# Patient Record
Sex: Male | Born: 1985 | Race: Black or African American | Hispanic: No | Smoking: Never smoker
Health system: Southern US, Community
[De-identification: ages and names within clinical notes are randomized; demographics above are authoritative.]

## PROBLEM LIST (undated history)

## (undated) ENCOUNTER — Emergency Department (HOSPITAL_BASED_OUTPATIENT_CLINIC_OR_DEPARTMENT_OTHER): Admission: EM | Payer: Self-pay | Source: Home / Self Care

## (undated) DIAGNOSIS — J45909 Unspecified asthma, uncomplicated: Secondary | ICD-10-CM

## (undated) HISTORY — PX: TARSAL TUNNEL RELEASE: SUR1099

## (undated) HISTORY — PX: APPENDECTOMY: SHX54

---

## 2020-05-19 ENCOUNTER — Other Ambulatory Visit: Payer: Self-pay

## 2020-05-19 ENCOUNTER — Emergency Department (HOSPITAL_COMMUNITY)
Admission: EM | Admit: 2020-05-19 | Discharge: 2020-05-20 | Disposition: A | Discharge status: Home / Self Care | Payer: Self-pay | Attending: Emergency Medicine | Admitting: Emergency Medicine

## 2020-05-19 ENCOUNTER — Encounter (HOSPITAL_COMMUNITY): Payer: Self-pay

## 2020-05-19 ENCOUNTER — Emergency Department (HOSPITAL_COMMUNITY): Payer: Self-pay

## 2020-05-19 DIAGNOSIS — R0602 Shortness of breath: Secondary | ICD-10-CM | POA: Insufficient documentation

## 2020-05-19 DIAGNOSIS — Z5321 Procedure and treatment not carried out due to patient leaving prior to being seen by health care provider: Secondary | ICD-10-CM | POA: Insufficient documentation

## 2020-05-19 DIAGNOSIS — R42 Dizziness and giddiness: Secondary | ICD-10-CM | POA: Insufficient documentation

## 2020-05-19 DIAGNOSIS — R002 Palpitations: Secondary | ICD-10-CM | POA: Insufficient documentation

## 2020-05-19 DIAGNOSIS — R0981 Nasal congestion: Secondary | ICD-10-CM | POA: Insufficient documentation

## 2020-05-19 DIAGNOSIS — R0789 Other chest pain: Secondary | ICD-10-CM | POA: Insufficient documentation

## 2020-05-19 LAB — TROPONIN I (HIGH SENSITIVITY): Troponin I (High Sensitivity): 4 ng/L (ref <18)

## 2020-05-19 LAB — BASIC METABOLIC PANEL
Anion gap: 9 (ref 5–15)
BUN: 14 mg/dL (ref 6–20)
CO2: 22 mmol/L (ref 22–32)
Calcium: 9 mg/dL (ref 8.9–10.3)
Chloride: 106 mmol/L (ref 98–111)
Creatinine, Ser: 0.99 mg/dL (ref 0.61–1.24)
GFR, Estimated: >60 mL/min (ref >60)
Glucose, Bld: 92 mg/dL (ref 70–99)
Potassium: 4 mmol/L (ref 3.5–5.1)
Sodium: 137 mmol/L (ref 135–145)

## 2020-05-19 LAB — CBC
HCT: 43.5 % (ref 39.0–52.0)
Hemoglobin: 14.2 g/dL (ref 13.0–17.0)
MCH: 26.8 pg (ref 26.0–34.0)
MCHC: 32.6 g/dL (ref 30.0–36.0)
MCV: 82.2 fL (ref 80.0–100.0)
Platelets: 274 10*3/uL (ref 150–400)
RBC: 5.29 MIL/uL (ref 4.22–5.81)
RDW: 13.9 % (ref 11.5–15.5)
WBC: 8.5 10*3/uL (ref 4.0–10.5)
nRBC: 0 % (ref 0.0–0.2)

## 2020-05-19 NOTE — ED Triage Notes (Signed)
Pt states he started having chest pains a few days ago and it worsened today. Pt has left chest pain, dizziness, shortness of breath, feeling like his heart is racing and nausea.

## 2020-05-19 NOTE — ED Provider Notes (Signed)
MSE was initiated and I personally evaluated the patient and placed orders (if any) at  10:56 PM on May 19, 2020.  Patient here with chest pain times the past few days.  States that symptoms worsened today.  Reports left-sided chest pain, dizziness, shortness of breath.  He reports associated palpitations.  Denies any heart or lung problems.  States that he has had some runny nose.  ROS: As listed above  PE: Alert and oriented Answers questions appropriately No respiratory distress Lungs are clear  Discussed with patient that their care has been initiated.   They are counseled that they will need to remain in the ED until the completion of their workup, including full H&P and results of any tests.  Risks of leaving the emergency department prior to completion of treatment were discussed. Patient was advised to inform ED staff if they are leaving before their treatment is complete. The patient acknowledged these risks and time was allowed for questions.    The patient appears stable so that the remainder of the MSE may be completed by another provider.    Roxy Horseman, PA-C 05/19/20 2256    Jacalyn Lefevre, MD 05/19/20 2258

## 2020-05-20 NOTE — ED Notes (Signed)
Called pt multiple times for room but no answer inside or out

## 2020-05-22 ENCOUNTER — Encounter (HOSPITAL_COMMUNITY): Payer: Self-pay | Admitting: Emergency Medicine

## 2020-05-22 ENCOUNTER — Emergency Department (HOSPITAL_COMMUNITY)
Admission: EM | Admit: 2020-05-22 | Discharge: 2020-05-23 | Discharge status: Against Medical Advice | Payer: Self-pay | Attending: Emergency Medicine | Admitting: Emergency Medicine

## 2020-05-22 ENCOUNTER — Emergency Department (HOSPITAL_COMMUNITY): Payer: Self-pay

## 2020-05-22 ENCOUNTER — Other Ambulatory Visit: Payer: Self-pay

## 2020-05-22 DIAGNOSIS — R0981 Nasal congestion: Secondary | ICD-10-CM | POA: Insufficient documentation

## 2020-05-22 DIAGNOSIS — J45909 Unspecified asthma, uncomplicated: Secondary | ICD-10-CM | POA: Insufficient documentation

## 2020-05-22 DIAGNOSIS — R519 Headache, unspecified: Secondary | ICD-10-CM | POA: Insufficient documentation

## 2020-05-22 DIAGNOSIS — R079 Chest pain, unspecified: Secondary | ICD-10-CM | POA: Insufficient documentation

## 2020-05-22 DIAGNOSIS — Z5321 Procedure and treatment not carried out due to patient leaving prior to being seen by health care provider: Secondary | ICD-10-CM | POA: Insufficient documentation

## 2020-05-22 HISTORY — DX: Unspecified asthma, uncomplicated: J45.909

## 2020-05-22 LAB — CBC
HCT: 43.7 % (ref 39.0–52.0)
Hemoglobin: 14.6 g/dL (ref 13.0–17.0)
MCH: 27.5 pg (ref 26.0–34.0)
MCHC: 33.4 g/dL (ref 30.0–36.0)
MCV: 82.5 fL (ref 80.0–100.0)
Platelets: 284 10*3/uL (ref 150–400)
RBC: 5.3 MIL/uL (ref 4.22–5.81)
RDW: 13.9 % (ref 11.5–15.5)
WBC: 9.2 10*3/uL (ref 4.0–10.5)
nRBC: 0 % (ref 0.0–0.2)

## 2020-05-22 LAB — BASIC METABOLIC PANEL
Anion gap: 7 (ref 5–15)
BUN: 14 mg/dL (ref 6–20)
CO2: 25 mmol/L (ref 22–32)
Calcium: 9.2 mg/dL (ref 8.9–10.3)
Chloride: 103 mmol/L (ref 98–111)
Creatinine, Ser: 1.04 mg/dL (ref 0.61–1.24)
GFR, Estimated: >60 mL/min (ref >60)
Glucose, Bld: 100 mg/dL — ABNORMAL HIGH (ref 70–99)
Potassium: 4 mmol/L (ref 3.5–5.1)
Sodium: 135 mmol/L (ref 135–145)

## 2020-05-22 LAB — TROPONIN I (HIGH SENSITIVITY)
Troponin I (High Sensitivity): 4 ng/L (ref <18)
Troponin I (High Sensitivity): 4 ng/L (ref <18)

## 2020-05-22 NOTE — ED Provider Notes (Addendum)
Emergency Medicine Provider Triage Evaluation Note  Christopher Long , a 35 y.o. male  was evaluated in triage.  Pt complains of chest pain, shortness of breath, nausea, nasal congestion, headache.  Symptoms began 2 days ago.  He was seen 2 days ago, but left before completing his work-up.  He reports symptoms have persisted, but not worsened.  No history of similar.  He does have history of asthma, reports symptoms not improved with nebulizer.  He denies sick contacts.  He is vaccinated for COVID, flu.  Recently moved to the area approximately 5 months ago.  He reports a fever this morning, none currently.  He denies cough, abdominal pain, urinary symptoms, abnormal bowel movements.  Review of Systems  Positive: Fever, congestion, shortness of breath, nausea Negative: Cough  Physical Exam  BP (!) 128/91 (BP Location: Left Arm)   Pulse 90   Temp 99.2 F (37.3 C)   Resp 17   Ht 5\' 11"  (1.803 m)   Wt 117.9 kg   SpO2 95%   BMI 36.26 kg/m  Gen:   Awake, no distress   Resp:  Normal effort, clear lung sounds. No ttp of chest wall MSK:   Moves extremities without difficulty  Other:  No ttp of abd.  Medical Decision Making  Medically screening exam initiated at 4:50 PM.  Appropriate orders placed.  Markice Torbert was informed that the remainder of the evaluation will be completed by another provider, this initial triage assessment does not replace that evaluation, and the importance of remaining in the ED until their evaluation is complete.  Labs and COVID testing ordered.  Chest x-ray from 2 days ago was negative, will not repeat   Ritta Slot, PA-C 05/22/20 1652  Radiology took the patient for x-ray before recognizing x-ray orders were discontinued.  Patient is adamant that he get x-rays at this time, in the setting of continued symptoms and pt's insistence, will place orders    05/24/20, PA-C 05/22/20 1715    05/24/20, MD 05/23/20 [phone]

## 2020-05-22 NOTE — ED Triage Notes (Signed)
Pt reports intermittent mid chest pain that radiates to left back with sob that started two days ago with nausea. Pt reports he feels the pain more with movement. Pt also reports sinus pressure and headache. Hx of asthma, attempted nebulizer at home with no relief.

## 2021-01-25 ENCOUNTER — Other Ambulatory Visit: Payer: Self-pay

## 2021-01-25 ENCOUNTER — Encounter (HOSPITAL_COMMUNITY): Payer: Self-pay | Admitting: Emergency Medicine

## 2021-01-25 ENCOUNTER — Emergency Department (HOSPITAL_COMMUNITY)
Admission: EM | Admit: 2021-01-25 | Discharge: 2021-01-25 | Disposition: A | Discharge status: Home / Self Care | Payer: Self-pay | Attending: Emergency Medicine | Admitting: Emergency Medicine

## 2021-01-25 DIAGNOSIS — Z20822 Contact with and (suspected) exposure to covid-19: Secondary | ICD-10-CM | POA: Insufficient documentation

## 2021-01-25 DIAGNOSIS — J020 Streptococcal pharyngitis: Secondary | ICD-10-CM | POA: Insufficient documentation

## 2021-01-25 LAB — RESP PANEL BY RT-PCR (FLU A&B, COVID) ARPGX2
Influenza A by PCR: NEGATIVE
Influenza B by PCR: NEGATIVE
SARS Coronavirus 2 by RT PCR: NEGATIVE

## 2021-01-25 LAB — GROUP A STREP BY PCR: Group A Strep by PCR: DETECTED — AB

## 2021-01-25 MED ORDER — PENICILLIN G BENZATHINE 1200000 UNIT/2ML IM SUSY
1.2000 10*6.[IU] | PREFILLED_SYRINGE | Freq: Once | INTRAMUSCULAR | Status: AC
  Administered 2021-01-25: 1.2 10*6.[IU] via INTRAMUSCULAR
  Filled 2021-01-25: qty 2

## 2021-01-25 MED ORDER — AMOXICILLIN 500 MG PO CAPS: 500.0000 mg | ORAL_CAPSULE | Freq: Once | ORAL | Status: DC

## 2021-01-25 MED ORDER — IBUPROFEN 800 MG PO TABS
800.0000 mg | ORAL_TABLET | Freq: Once | ORAL | Status: AC
Start: 2021-01-25 — End: 2021-01-25
  Administered 2021-01-25: 800 mg via ORAL
  Filled 2021-01-25: qty 1

## 2021-01-25 MED ORDER — PENICILLIN G BENZATHINE 1200000 UNIT/2ML IM SUSY: 1.2000 10*6.[IU] | PREFILLED_SYRINGE | Freq: Once | INTRAMUSCULAR | Status: DC

## 2021-01-25 MED ORDER — CETIRIZINE HCL 10 MG PO TABS: 10.0000 mg | ORAL_TABLET | Freq: Every day | ORAL | 0 refills | Status: DC

## 2021-01-25 MED ORDER — IBUPROFEN 600 MG PO TABS: 600.0000 mg | ORAL_TABLET | Freq: Four times a day (QID) | ORAL | 0 refills | Status: DC | PRN

## 2021-01-25 MED ORDER — LIDOCAINE VISCOUS HCL 2 % MT SOLN
15.0000 mL | Freq: Once | OROMUCOSAL | Status: AC
Start: 2021-01-25 — End: 2021-01-25
  Administered 2021-01-25: 15 mL via OROMUCOSAL
  Filled 2021-01-25: qty 15

## 2021-01-25 MED ORDER — DEXAMETHASONE SODIUM PHOSPHATE 10 MG/ML IJ SOLN
10.0000 mg | Freq: Once | INTRAMUSCULAR | Status: AC
  Administered 2021-01-25: 10 mg via INTRAMUSCULAR
  Filled 2021-01-25: qty 1

## 2021-01-25 NOTE — ED Notes (Signed)
Patient not visualized in room to provide with discharge papers. Told staff he was leaving prior to them being printed.

## 2021-01-25 NOTE — ED Provider Notes (Signed)
[facility]-EMERGENCY DEPT Provider Note   CSN: [id number] Arrival date & time: 01/25/21  1523     History  No chief complaint on file.   Christopher Long is a 36 y.o. male.  Christopher Long is a 36 y.o. male who presents to the emergency department for evaluation of 2 days of fever, sore throat, headache and neck stiffness.  Reports he has had nasal congestion going on for about a week and sinus pressure.  Reports he has severe sore throat, has not had much to eat or drink due to pain with swallowing but is able to tolerate secretions.  Last took Tylenol at 1230 for fever and headache.  He reports an occasional cough with mucus, denies chest pain or shortness of breath.  No abdominal pain, nausea or vomiting.  Unsure of sick contacts.  Recently moved to the area.  The history is provided by the patient.      Home Medications Prior to Admission medications   Medication Sig Start Date End Date Taking? Authorizing Provider  cetirizine (ZYRTEC ALLERGY) 10 MG tablet Take 1 tablet (10 mg total) by mouth daily. 01/25/21  Yes Dartha Lodge, PA-C  ibuprofen (ADVIL) 600 MG tablet Take 1 tablet (600 mg total) by mouth every 6 (six) hours as needed. 01/25/21  Yes Dartha Lodge, PA-C      Allergies    Patient has no known allergies.    Review of Systems   Review of Systems  Constitutional:  Positive for chills and fever.  HENT:  Positive for congestion, rhinorrhea, sore throat and trouble swallowing.   Respiratory:  Positive for cough. Negative for shortness of breath.   Cardiovascular:  Negative for chest pain.  Gastrointestinal:  Negative for abdominal pain, nausea and vomiting.  Musculoskeletal:  Negative for neck pain and neck stiffness.  Skin:  Negative for rash.  Neurological:  Positive for headaches.   Physical Exam Updated Vital Signs BP 133/82 (BP Location: Right Arm)    Pulse (!) 108    Temp (!) 100.6 F (38.1 C) (Oral)    Resp 18    Ht 5\' 11"  (1.803 m)     Wt 118 kg    SpO2 98%    BMI 36.28 kg/m  Physical Exam Vitals and nursing note reviewed.  Constitutional:      General: He is not in acute distress.    Appearance: Normal appearance. He is well-developed. He is not ill-appearing or diaphoretic.  HENT:     Head: Normocephalic and atraumatic.     Nose: Congestion and rhinorrhea present.     Mouth/Throat:     Mouth: Mucous membranes are moist.     Pharynx: Oropharyngeal exudate and posterior oropharyngeal erythema present.     Comments: Posterior oropharynx is erythematous with tonsillar edema and exudate, uvula is midline, no trismus, normal phonation, tolerating secretions Eyes:     General:        Right eye: No discharge.        Left eye: No discharge.  Cardiovascular:     Rate and Rhythm: Normal rate and regular rhythm.     Heart sounds: Normal heart sounds.  Pulmonary:     Effort: Pulmonary effort is normal. No respiratory distress.     Breath sounds: Normal breath sounds.  Abdominal:     General: Bowel sounds are normal. There is no distension.     Palpations: Abdomen is soft. There is no mass.     Tenderness: There is  no abdominal tenderness.  Musculoskeletal:        General: No deformity.     Cervical back: Neck supple. No rigidity.  Lymphadenopathy:     Cervical: Cervical adenopathy present.  Skin:    General: Skin is warm and dry.  Neurological:     Mental Status: He is alert and oriented to person, place, and time.     Coordination: Coordination normal.     Comments: Speech is clear, able to follow commands Moves extremities without ataxia, coordination intact  Psychiatric:        Mood and Affect: Mood normal.        Behavior: Behavior normal.    ED Results / Procedures / Treatments   Labs (all labs ordered are listed, but only abnormal results are displayed) Labs Reviewed  GROUP A STREP BY PCR - Abnormal; Notable for the following components:      Result Value   Group A Strep by PCR DETECTED (*)    All  other components within normal limits  RESP PANEL BY RT-PCR (FLU A&B, COVID) ARPGX2    EKG None  Radiology No results found.  Procedures Procedures    Medications Ordered in ED Medications  ibuprofen (ADVIL) tablet 800 mg (800 mg Oral Given 01/25/21 1549)  dexamethasone (DECADRON) injection 10 mg (10 mg Intramuscular Given 01/25/21 1710)  lidocaine (XYLOCAINE) 2 % viscous mouth solution 15 mL (15 mLs Mouth/Throat Given 01/25/21 1713)  penicillin g benzathine (BICILLIN LA) 1200000 UNIT/2ML injection 1.2 Million Units (1.2 Million Units Intramuscular Given 01/25/21 1711)    ED Course/ Medical Decision Making/ A&P                            Pt febrile with tonsillar exudate, cervical lymphadenopathy, & dysphagia; diagnosis of bacterial pharyngitis. Treated in the ED with steroids, NSAIDs, viscous lidocaine and PCN IM.  Pt appears mildly dehydrated, discussed importance of water rehydration. Presentation non concerning for PTA or RPA, so do not feel labs or imaging is indicated at this time. No trismus or uvula deviation. Specific return precautions discussed. Pt able to drink water in ED without difficulty with intact air way. Recommended PCP follow up.        Final Clinical Impression(s) / ED Diagnoses Final diagnoses:  Strep throat    Rx / DC Orders ED Discharge Orders          Ordered    ibuprofen (ADVIL) 600 MG tablet  Every 6 hours PRN        01/25/21 1726    cetirizine (ZYRTEC ALLERGY) 10 MG tablet  Daily        01/25/21 1726              Legrand Rams 01/25/21 2334    Lorre Nick, MD 01/29/21 1719

## 2021-01-25 NOTE — ED Triage Notes (Addendum)
Patient reports feeling unwell, HA, sore throat and neck stiffness since yesterday. Submandibular lymph nodes swollen and tender to palpation. Throat w/ erythema, due to tongue unable to visualize tonsils. Took tylenol at 12:30.

## 2021-01-25 NOTE — Discharge Instructions (Signed)
You were treated today with antibiotics for strep throat, this should start to improve over the next 48 hours.  You may use ibuprofen and Tylenol every 6 hours for pain as well as Cepacol throat lozenges.  Make sure you are drinking plenty of fluids.  To help with nasal congestion take Zyrtec daily.  Suspect some of this is likely related to allergies since you are new to the area.  You can use the phone number on your paperwork today to establish primary care with a local PCP.  Return for worsening throat pain, fevers, difficulty swallowing or breathing or any other new or concerning symptoms.

## 2021-10-20 ENCOUNTER — Encounter (HOSPITAL_COMMUNITY): Payer: Self-pay

## 2021-10-20 ENCOUNTER — Emergency Department (HOSPITAL_COMMUNITY): Payer: Self-pay

## 2021-10-20 ENCOUNTER — Emergency Department (HOSPITAL_COMMUNITY)
Admission: EM | Admit: 2021-10-20 | Discharge: 2021-10-20 | Discharge status: Against Medical Advice | Payer: Self-pay | Attending: Emergency Medicine | Admitting: Emergency Medicine

## 2021-10-20 DIAGNOSIS — Z5321 Procedure and treatment not carried out due to patient leaving prior to being seen by health care provider: Secondary | ICD-10-CM | POA: Insufficient documentation

## 2021-10-20 DIAGNOSIS — R079 Chest pain, unspecified: Secondary | ICD-10-CM | POA: Insufficient documentation

## 2021-10-20 DIAGNOSIS — R110 Nausea: Secondary | ICD-10-CM | POA: Insufficient documentation

## 2021-10-20 LAB — CBC
HCT: 40.6 % (ref 39.0–52.0)
Hemoglobin: 13.3 g/dL (ref 13.0–17.0)
MCH: 27.3 pg (ref 26.0–34.0)
MCHC: 32.8 g/dL (ref 30.0–36.0)
MCV: 83.4 fL (ref 80.0–100.0)
Platelets: 257 10*3/uL (ref 150–400)
RBC: 4.87 MIL/uL (ref 4.22–5.81)
RDW: 14.2 % (ref 11.5–15.5)
WBC: 8.8 10*3/uL (ref 4.0–10.5)
nRBC: 0 % (ref 0.0–0.2)

## 2021-10-20 LAB — BASIC METABOLIC PANEL
Anion gap: 7 (ref 5–15)
BUN: 16 mg/dL (ref 6–20)
CO2: 24 mmol/L (ref 22–32)
Calcium: 8.5 mg/dL — ABNORMAL LOW (ref 8.9–10.3)
Chloride: 107 mmol/L (ref 98–111)
Creatinine, Ser: 1.02 mg/dL (ref 0.61–1.24)
GFR, Estimated: >60 mL/min (ref >60)
Glucose, Bld: 102 mg/dL — ABNORMAL HIGH (ref 70–99)
Potassium: 3.7 mmol/L (ref 3.5–5.1)
Sodium: 138 mmol/L (ref 135–145)

## 2021-10-20 LAB — TROPONIN I (HIGH SENSITIVITY): Troponin I (High Sensitivity): 4 ng/L (ref <18)

## 2021-10-20 NOTE — ED Notes (Signed)
Called 3 times 

## 2021-10-20 NOTE — ED Triage Notes (Signed)
Pt states that he has been having CP and nausea for the past 3 hours, denies SOB or dizziness

## 2022-04-23 IMAGING — CR DG CHEST 2V
2 series · 2 of 2 positions shown · non-contrast
Comparison: None.

CLINICAL DATA: Chest pain

EXAM:
CHEST - 2 VIEW

[chest pa]
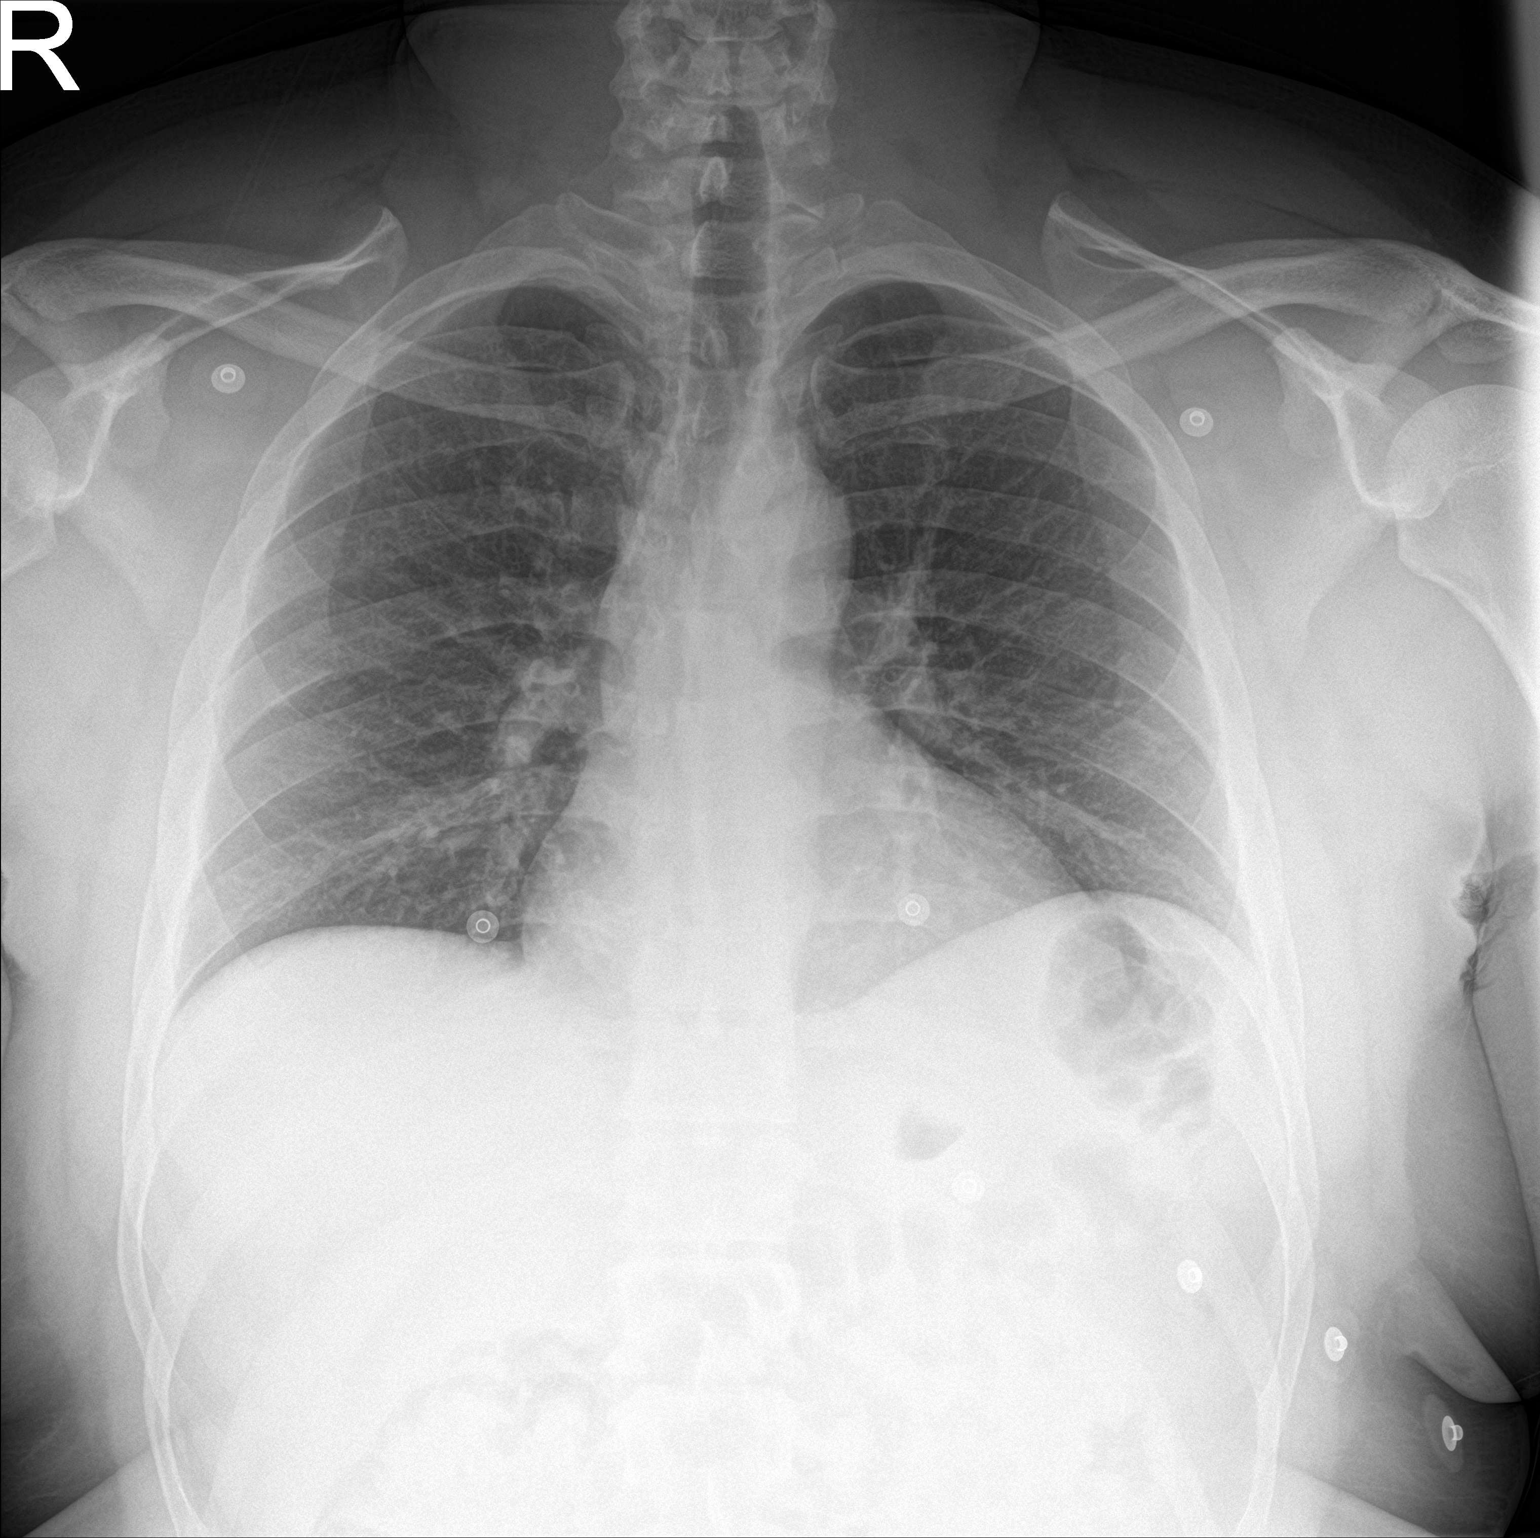

[chest lat]
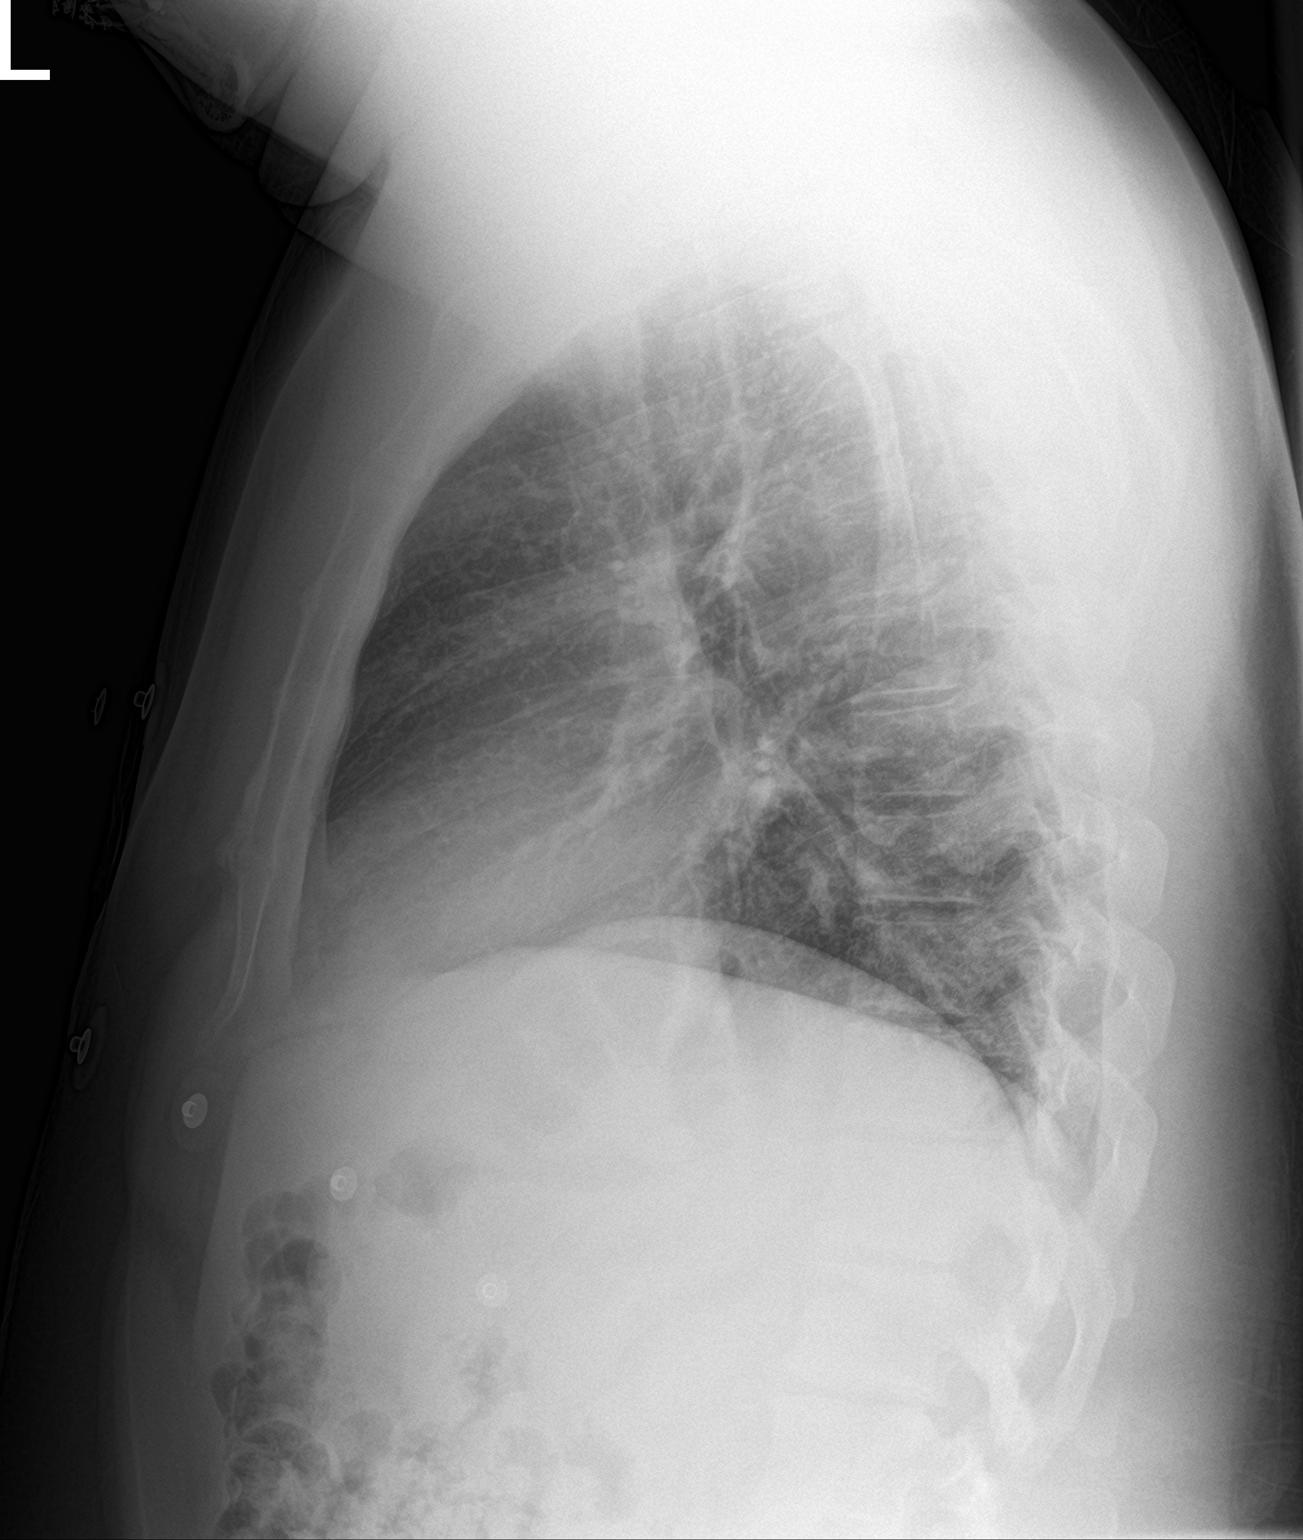

[2 of 2 positions shown; findings below may reference images not displayed]

FINDINGS: Lungs are clear. Heart size and pulmonary vascularity are normal. No
adenopathy. No pneumothorax. No bone lesions.
IMPRESSION: Lungs clear.  Cardiac silhouette normal.

## 2022-04-26 IMAGING — DX DG CHEST 2V
2 series · 2 of 2 positions shown · non-contrast
Comparison: May 19, 2020

CLINICAL DATA: Chest pain.

EXAM:
CHEST - 2 VIEW

[chest pa]
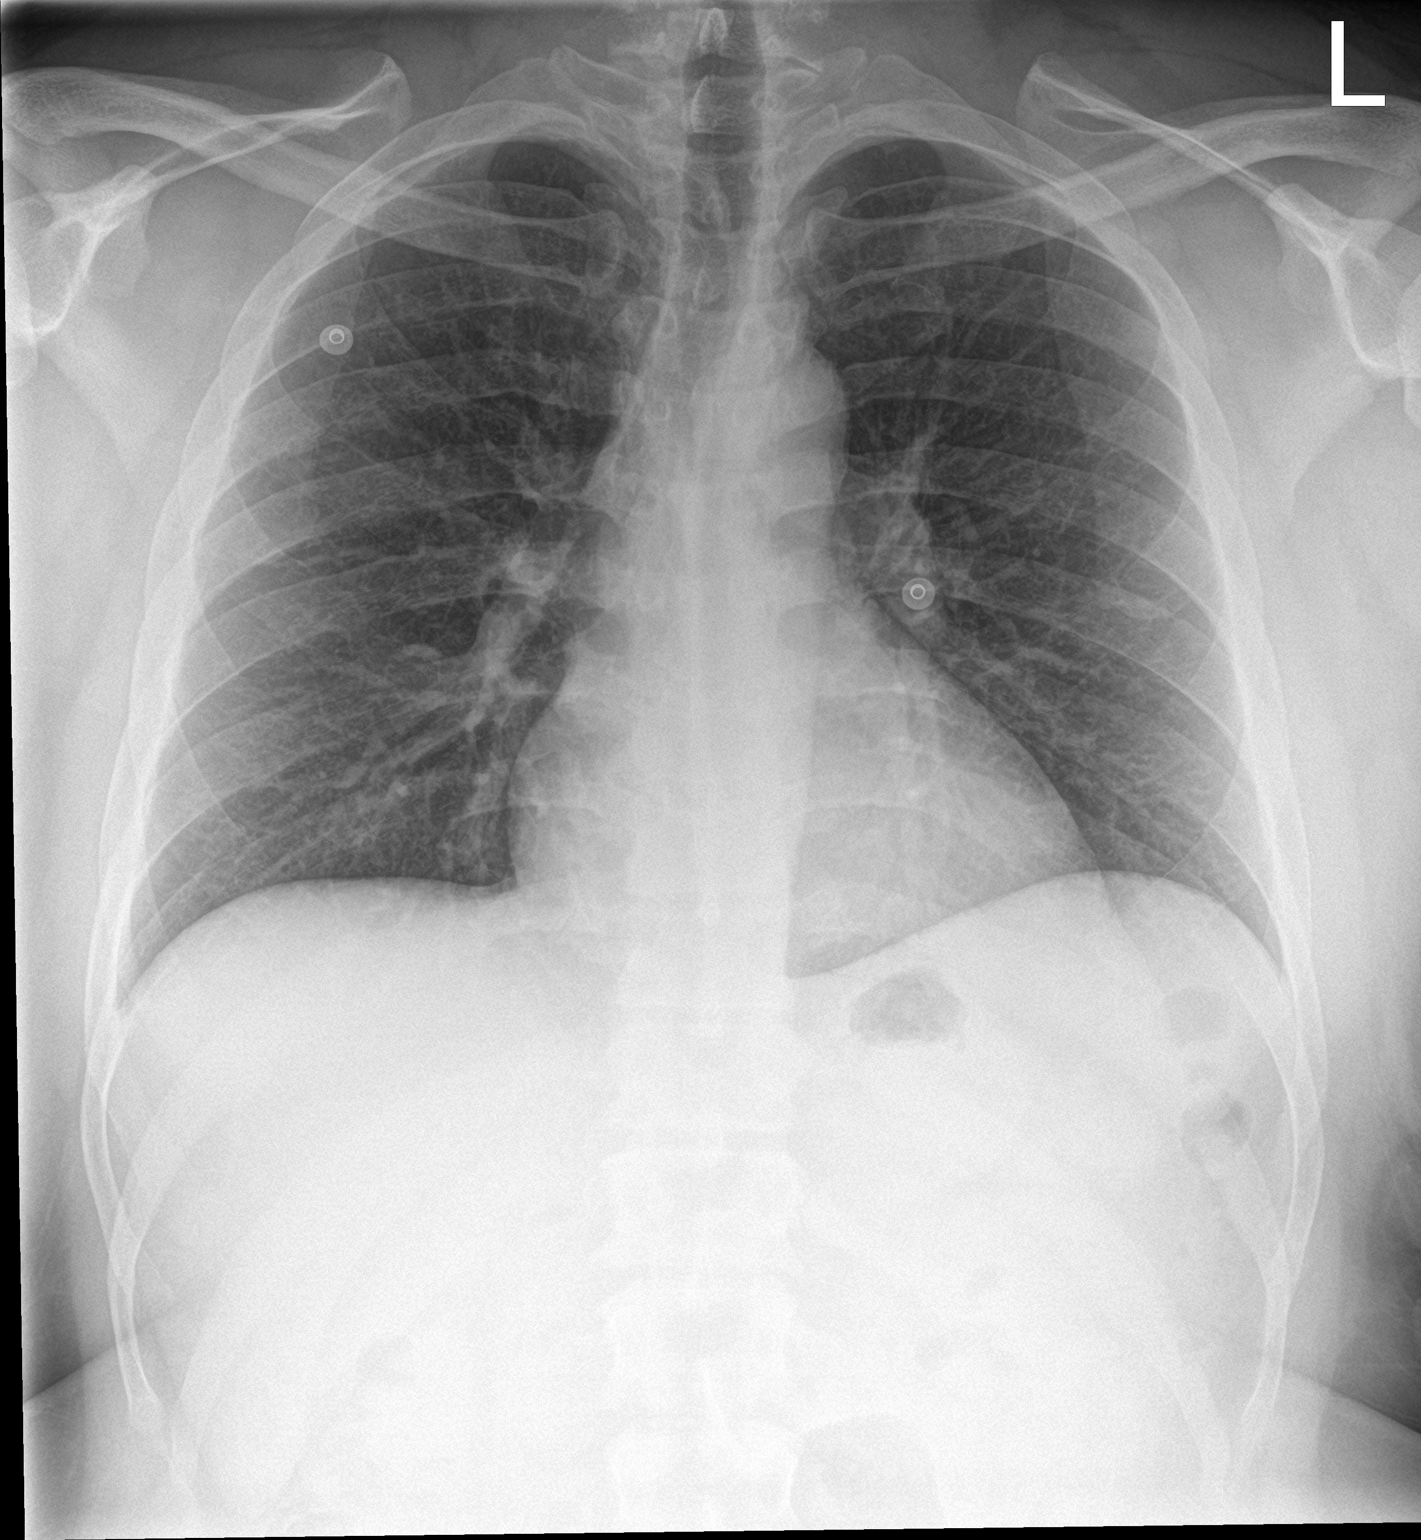

[chest lat]
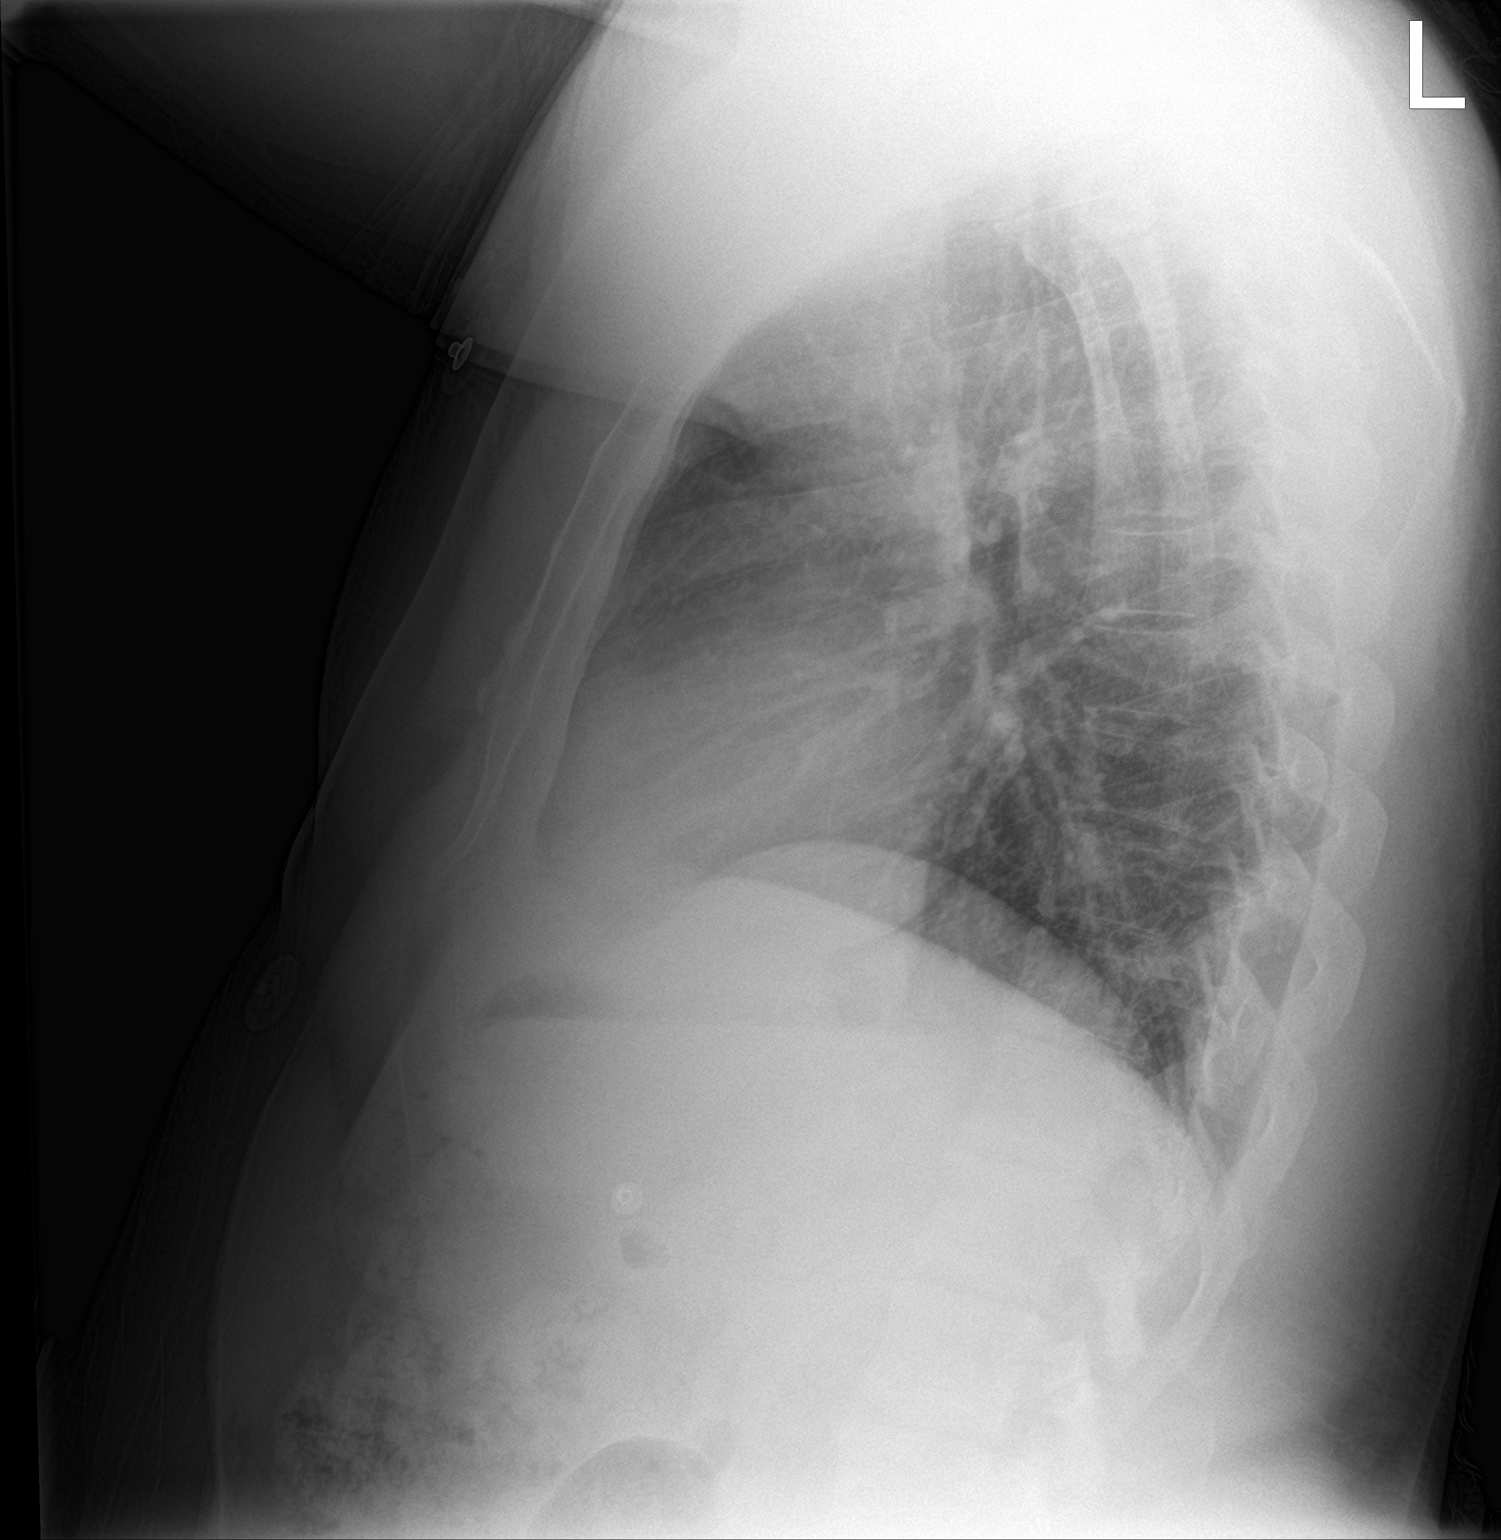

[2 of 2 positions shown; findings below may reference images not displayed]

FINDINGS: The heart size and mediastinal contours are within normal limits.
Both lungs are clear. The visualized skeletal structures are
unremarkable.
IMPRESSION: No active cardiopulmonary disease.

## 2023-03-26 ENCOUNTER — Emergency Department (HOSPITAL_COMMUNITY)
Admission: EM | Admit: 2023-03-26 | Discharge: 2023-03-27 | Discharge status: Against Medical Advice | Payer: Self-pay | Attending: Emergency Medicine | Admitting: Emergency Medicine

## 2023-03-26 ENCOUNTER — Encounter (HOSPITAL_COMMUNITY): Payer: Self-pay

## 2023-03-26 ENCOUNTER — Other Ambulatory Visit: Payer: Self-pay

## 2023-03-26 DIAGNOSIS — Z5321 Procedure and treatment not carried out due to patient leaving prior to being seen by health care provider: Secondary | ICD-10-CM | POA: Insufficient documentation

## 2023-03-26 DIAGNOSIS — L97929 Non-pressure chronic ulcer of unspecified part of left lower leg with unspecified severity: Secondary | ICD-10-CM | POA: Insufficient documentation

## 2023-03-26 DIAGNOSIS — J029 Acute pharyngitis, unspecified: Secondary | ICD-10-CM | POA: Insufficient documentation

## 2023-03-26 LAB — GROUP A STREP BY PCR: Group A Strep by PCR: NOT DETECTED

## 2023-03-26 NOTE — ED Notes (Signed)
 No response in lobby over 15+ mins

## 2023-03-26 NOTE — ED Provider Triage Note (Signed)
 Emergency Medicine Provider Triage Evaluation Note  Christopher Long , a 38 y.o. male  was evaluated in triage.  Pt complains of sore throat. Sore throat x 2 days.  Son recently had strep throat.  Also report ulcers to L thigh, that is painful.  Ongoing for several days.  No new sexual partner.  No hx of herpes or syphilis.    Review of Systems  Positive: As above Negative: As above  Physical Exam  BP (!) 148/105 (BP Location: Right Arm)   Pulse 93   Temp 99.1 F (37.3 C)   Resp (!) 24   Ht 5\' 10"  (1.778 m)   Wt 129.3 kg   SpO2 97%   BMI 40.89 kg/m  Gen:   Awake, no distress   Resp:  Normal effort  MSK:   Moves extremities without difficulty  Other:  Ulceration noted to L medial thigh  Medical Decision Making  Medically screening exam initiated at 5:18 PM.  Appropriate orders placed.  Christopher Long was informed that the remainder of the evaluation will be completed by another provider, this initial triage assessment does not replace that evaluation, and the importance of remaining in the ED until their evaluation is complete.     Christopher Helper, PA-C 03/26/23 [phone]

## 2023-03-26 NOTE — ED Notes (Signed)
No response for vitals  

## 2023-03-26 NOTE — ED Triage Notes (Signed)
 Pt reports sore throat  x 3 days and reports pain with swallowing. His son was diagnosed with strep throat 2 days ago.

## 2023-03-29 LAB — HSV CULTURE AND TYPING

## 2023-05-29 ENCOUNTER — Emergency Department (HOSPITAL_COMMUNITY): Payer: Self-pay

## 2023-05-29 ENCOUNTER — Other Ambulatory Visit: Payer: Self-pay

## 2023-05-29 ENCOUNTER — Encounter (HOSPITAL_COMMUNITY): Payer: Self-pay

## 2023-05-29 ENCOUNTER — Inpatient Hospital Stay (HOSPITAL_COMMUNITY)
Admission: EM | Admit: 2023-05-29 | Discharge: 2023-05-30 | DRG: 603 | Disposition: A | Discharge status: Home / Self Care | Payer: Self-pay | Attending: General Surgery | Admitting: General Surgery

## 2023-05-29 DIAGNOSIS — Z6836 Body mass index (BMI) 36.0-36.9, adult: Secondary | ICD-10-CM

## 2023-05-29 DIAGNOSIS — R188 Other ascites: Secondary | ICD-10-CM | POA: Diagnosis present

## 2023-05-29 DIAGNOSIS — Z9049 Acquired absence of other specified parts of digestive tract: Secondary | ICD-10-CM

## 2023-05-29 DIAGNOSIS — Z79899 Other long term (current) drug therapy: Secondary | ICD-10-CM

## 2023-05-29 DIAGNOSIS — L03311 Cellulitis of abdominal wall: Secondary | ICD-10-CM | POA: Diagnosis present

## 2023-05-29 DIAGNOSIS — Z881 Allergy status to other antibiotic agents status: Secondary | ICD-10-CM

## 2023-05-29 DIAGNOSIS — L02211 Cutaneous abscess of abdominal wall: Principal | ICD-10-CM | POA: Diagnosis present

## 2023-05-29 DIAGNOSIS — E669 Obesity, unspecified: Secondary | ICD-10-CM | POA: Diagnosis present

## 2023-05-29 LAB — URINALYSIS, ROUTINE W REFLEX MICROSCOPIC
Bilirubin Urine: NEGATIVE
Glucose, UA: 150 mg/dL — AB
Hgb urine dipstick: NEGATIVE
Ketones, ur: NEGATIVE mg/dL
Leukocytes,Ua: NEGATIVE
Nitrite: NEGATIVE
Protein, ur: NEGATIVE mg/dL
Specific Gravity, Urine: 1.02 (ref 1.005–1.030)
pH: 7 (ref 5.0–8.0)

## 2023-05-29 LAB — COMPREHENSIVE METABOLIC PANEL WITH GFR
ALT: 21 U/L (ref 0–44)
AST: 20 U/L (ref 15–41)
Albumin: 3.8 g/dL (ref 3.5–5.0)
Alkaline Phosphatase: 57 U/L (ref 38–126)
Anion gap: 9 (ref 5–15)
BUN: 8 mg/dL (ref 6–20)
CO2: 23 mmol/L (ref 22–32)
Calcium: 9.1 mg/dL (ref 8.9–10.3)
Chloride: 102 mmol/L (ref 98–111)
Creatinine, Ser: 1.05 mg/dL (ref 0.61–1.24)
GFR, Estimated: >60 mL/min (ref >60)
Glucose, Bld: 108 mg/dL — ABNORMAL HIGH (ref 70–99)
Potassium: 3.7 mmol/L (ref 3.5–5.1)
Sodium: 134 mmol/L — ABNORMAL LOW (ref 135–145)
Total Bilirubin: 1 mg/dL (ref 0.0–1.2)
Total Protein: 7.8 g/dL (ref 6.5–8.1)

## 2023-05-29 LAB — CBC
HCT: 43.1 % (ref 39.0–52.0)
Hemoglobin: 14.2 g/dL (ref 13.0–17.0)
MCH: 27.3 pg (ref 26.0–34.0)
MCHC: 32.9 g/dL (ref 30.0–36.0)
MCV: 82.7 fL (ref 80.0–100.0)
Platelets: 277 10*3/uL (ref 150–400)
RBC: 5.21 MIL/uL (ref 4.22–5.81)
RDW: 13.7 % (ref 11.5–15.5)
WBC: 14.3 10*3/uL — ABNORMAL HIGH (ref 4.0–10.5)
nRBC: 0 % (ref 0.0–0.2)

## 2023-05-29 LAB — LACTIC ACID, PLASMA: Lactic Acid, Venous: 1 mmol/L (ref 0.5–1.9)

## 2023-05-29 LAB — HIV ANTIBODY (ROUTINE TESTING W REFLEX): HIV Screen 4th Generation wRfx: NONREACTIVE

## 2023-05-29 LAB — LIPASE, BLOOD: Lipase: 25 U/L (ref 11–51)

## 2023-05-29 MED ORDER — IOHEXOL 350 MG/ML SOLN
75.0000 mL | Freq: Once | INTRAVENOUS | Status: AC | PRN
  Administered 2023-05-29: 75 mL via INTRAVENOUS

## 2023-05-29 MED ORDER — VANCOMYCIN HCL 1750 MG/350ML IV SOLN
1750.0000 mg | Freq: Once | INTRAVENOUS | Status: AC
  Administered 2023-05-29: 1750 mg via INTRAVENOUS
  Filled 2023-05-29: qty 350

## 2023-05-29 MED ORDER — VANCOMYCIN HCL 1.25 G IV SOLR
1250.0000 mg | Freq: Two times a day (BID) | INTRAVENOUS | Status: DC
  Administered 2023-05-29 – 2023-05-30 (×2): 1250 mg via INTRAVENOUS
  Filled 2023-05-29 (×2): qty 25

## 2023-05-29 MED ORDER — METHOCARBAMOL 500 MG PO TABS
500.0000 mg | ORAL_TABLET | Freq: Three times a day (TID) | ORAL | Status: DC | PRN
  Administered 2023-05-29 – 2023-05-30 (×2): 500 mg via ORAL
  Filled 2023-05-29 (×2): qty 1

## 2023-05-29 MED ORDER — VANCOMYCIN HCL IN DEXTROSE 1-5 GM/200ML-% IV SOLN: 1000.0000 mg | Freq: Once | INTRAVENOUS | Status: DC

## 2023-05-29 MED ORDER — KCL IN DEXTROSE-NACL 20-5-0.45 MEQ/L-%-% IV SOLN
INTRAVENOUS | Status: AC
  Filled 2023-05-29: qty 1000

## 2023-05-29 MED ORDER — ACETAMINOPHEN 500 MG PO TABS
1000.0000 mg | ORAL_TABLET | Freq: Once | ORAL | Status: AC
  Administered 2023-05-29: 1000 mg via ORAL
  Filled 2023-05-29: qty 2

## 2023-05-29 MED ORDER — MORPHINE SULFATE (PF) 2 MG/ML IV SOLN
1.0000 mg | INTRAVENOUS | Status: DC | PRN
  Administered 2023-05-30: 1 mg via INTRAVENOUS
  Filled 2023-05-29 (×3): qty 1

## 2023-05-29 MED ORDER — ONDANSETRON HCL 4 MG/2ML IJ SOLN
4.0000 mg | Freq: Once | INTRAMUSCULAR | Status: AC
  Administered 2023-05-29: 4 mg via INTRAVENOUS
  Filled 2023-05-29: qty 2

## 2023-05-29 MED ORDER — POLYETHYLENE GLYCOL 3350 17 G PO PACK: 17.0000 g | PACK | Freq: Every day | ORAL | Status: DC | PRN

## 2023-05-29 MED ORDER — OXYCODONE HCL 5 MG PO TABS
5.0000 mg | ORAL_TABLET | ORAL | Status: DC | PRN
  Administered 2023-05-30: 10 mg via ORAL
  Filled 2023-05-29: qty 2

## 2023-05-29 MED ORDER — HYDROMORPHONE HCL 1 MG/ML IJ SOLN
0.5000 mg | Freq: Once | INTRAMUSCULAR | Status: AC
  Administered 2023-05-29: 0.5 mg via INTRAVENOUS
  Filled 2023-05-29: qty 1

## 2023-05-29 MED ORDER — METHOCARBAMOL 1000 MG/10ML IJ SOLN
500.0000 mg | Freq: Three times a day (TID) | INTRAMUSCULAR | Status: DC | PRN
  Administered 2023-05-30: 500 mg via INTRAVENOUS
  Filled 2023-05-29: qty 10

## 2023-05-29 MED ORDER — SODIUM CHLORIDE 0.9 % IV BOLUS
1000.0000 mL | Freq: Once | INTRAVENOUS | Status: AC
  Administered 2023-05-29: 1000 mL via INTRAVENOUS

## 2023-05-29 MED ORDER — VANCOMYCIN HCL 1.25 G IV SOLR: 1250.0000 mg | Freq: Two times a day (BID) | INTRAVENOUS | Status: DC

## 2023-05-29 MED ORDER — PIPERACILLIN-TAZOBACTAM 3.375 G IVPB
3.3750 g | Freq: Three times a day (TID) | INTRAVENOUS | Status: DC
  Administered 2023-05-29 – 2023-05-30 (×3): 3.375 g via INTRAVENOUS
  Filled 2023-05-29 (×4): qty 50

## 2023-05-29 MED ORDER — MORPHINE SULFATE (PF) 4 MG/ML IV SOLN
4.0000 mg | Freq: Once | INTRAVENOUS | Status: AC
  Administered 2023-05-29: 4 mg via INTRAVENOUS
  Filled 2023-05-29: qty 1

## 2023-05-29 MED ORDER — ONDANSETRON 4 MG PO TBDP: 4.0000 mg | ORAL_TABLET | Freq: Four times a day (QID) | ORAL | Status: DC | PRN

## 2023-05-29 MED ORDER — MELATONIN 3 MG PO TABS: 3.0000 mg | ORAL_TABLET | Freq: Every evening | ORAL | Status: DC | PRN

## 2023-05-29 MED ORDER — DIPHENHYDRAMINE HCL 25 MG PO CAPS: 25.0000 mg | ORAL_CAPSULE | Freq: Four times a day (QID) | ORAL | Status: DC | PRN

## 2023-05-29 MED ORDER — METOPROLOL TARTRATE 5 MG/5ML IV SOLN: 5.0000 mg | Freq: Four times a day (QID) | INTRAVENOUS | Status: DC | PRN

## 2023-05-29 MED ORDER — SIMETHICONE 80 MG PO CHEW: 40.0000 mg | CHEWABLE_TABLET | Freq: Four times a day (QID) | ORAL | Status: DC | PRN

## 2023-05-29 MED ORDER — ACETAMINOPHEN 500 MG PO TABS
1000.0000 mg | ORAL_TABLET | Freq: Four times a day (QID) | ORAL | Status: DC
  Administered 2023-05-29 – 2023-05-30 (×3): 1000 mg via ORAL
  Filled 2023-05-29 (×4): qty 2

## 2023-05-29 MED ORDER — ENOXAPARIN SODIUM 60 MG/0.6ML IJ SOSY
60.0000 mg | PREFILLED_SYRINGE | INTRAMUSCULAR | Status: DC
  Administered 2023-05-29: 60 mg via SUBCUTANEOUS
  Filled 2023-05-29 (×2): qty 0.6

## 2023-05-29 MED ORDER — HYDRALAZINE HCL 20 MG/ML IJ SOLN: 10.0000 mg | INTRAMUSCULAR | Status: DC | PRN

## 2023-05-29 MED ORDER — DIPHENHYDRAMINE HCL 50 MG/ML IJ SOLN: 25.0000 mg | Freq: Four times a day (QID) | INTRAMUSCULAR | Status: DC | PRN

## 2023-05-29 MED ORDER — ONDANSETRON HCL 4 MG/2ML IJ SOLN: 4.0000 mg | Freq: Four times a day (QID) | INTRAMUSCULAR | Status: DC | PRN

## 2023-05-29 NOTE — ED Notes (Signed)
 Patient transported to CT

## 2023-05-29 NOTE — ED Provider Notes (Signed)
 [facility] EMERGENCY DEPARTMENT AT [facility] Provider Note   CSN: [id number] Arrival date & time: 05/29/23  0740     History  Chief Complaint  Patient presents with   Abdominal Pain    Christopher Long is a 38 y.o. male.  Patient with past medical history of asthma is presenting to emergency room with complaint of abdominal pain for 2 days.  He states that 3 days ago he was doing some heavy lifting with his son and since then has had some right sided back soreness, no red flag symptoms, but also developed abdominal pain a day after that.  Locates abdominal pain to suprapubic area.  Has noted this area is tender to the touch and feels swollen, can't lay on stomach and pain worse with movement.  He has had prior abdominal surgery.  Notes decreased appetite but denies nausea vomiting.  Has not had a bowel movement in 2 days. Notes fever and chills last night, but did not take temp.    Abdominal Pain      Home Medications Prior to Admission medications   Medication Sig Start Date End Date Taking? Authorizing Provider  cetirizine  (ZYRTEC  ALLERGY) 10 MG tablet Take 1 tablet (10 mg total) by mouth daily. 01/25/21   Kehrli, Kelsey F, PA-C  ibuprofen  (ADVIL ) 600 MG tablet Take 1 tablet (600 mg total) by mouth every 6 (six) hours as needed. 01/25/21   Kehrli, Kelsey F, PA-C      Allergies    Patient has no known allergies.    Review of Systems   Review of Systems  Gastrointestinal:  Positive for abdominal pain.    Physical Exam Updated Vital Signs BP (!) 144/109 (BP Location: Right Arm)   Pulse (!) 104   Temp 99.6 F (37.6 C)   Resp 16   Ht 5\' 11"  (1.803 m)   Wt 117.9 kg   SpO2 100%   BMI 36.26 kg/m  Physical Exam Vitals and nursing note reviewed.  Constitutional:      General: He is not in acute distress.    Appearance: He is not toxic-appearing.  HENT:     Head: Normocephalic and atraumatic.  Eyes:     General: No scleral icterus.    Conjunctiva/sclera:  Conjunctivae normal.  Cardiovascular:     Rate and Rhythm: Normal rate and regular rhythm.     Pulses: Normal pulses.     Heart sounds: Normal heart sounds.  Pulmonary:     Effort: Pulmonary effort is normal. No respiratory distress.     Breath sounds: Normal breath sounds.  Abdominal:     General: Abdomen is flat. Bowel sounds are normal. There is no distension.     Palpations: Abdomen is soft. There is no mass.     Tenderness: There is abdominal tenderness.     Comments: Umbilical incision clean, dry, intact.  Lower abd wall cellulitis.   Genitourinary:    Testes:        Right: Swelling not present.        Left: Swelling not present.  Musculoskeletal:     Comments: No lumbar midline tenderness. Right paravertebral TTP and right low back/hip TTP.   Skin:    General: Skin is warm and dry.     Findings: No lesion.  Neurological:     General: No focal deficit present.     Mental Status: He is alert and oriented to person, place, and time. Mental status is at baseline.  ED Results / Procedures / Treatments   Labs (all labs ordered are listed, but only abnormal results are displayed) Labs Reviewed  COMPREHENSIVE METABOLIC PANEL WITH GFR - Abnormal; Notable for the following components:      Result Value   Sodium 134 (*)    Glucose, Bld 108 (*)    All other components within normal limits  CBC - Abnormal; Notable for the following components:   WBC 14.3 (*)    All other components within normal limits  URINALYSIS, ROUTINE W REFLEX MICROSCOPIC - Abnormal; Notable for the following components:   Glucose, UA 150 (*)    All other components within normal limits  CULTURE, BLOOD (ROUTINE X 2)  CULTURE, BLOOD (ROUTINE X 2)  LIPASE, BLOOD  LACTIC ACID, PLASMA  LACTIC ACID, PLASMA  HIV ANTIBODY (ROUTINE TESTING W REFLEX)    EKG None  Radiology CT L-SPINE NO CHARGE Result Date: 05/29/2023 CLINICAL DATA:  Lower abdominal pain.  Right-sided back pain. EXAM: CT LUMBAR SPINE  WITHOUT CONTRAST TECHNIQUE: Multidetector CT imaging of the lumbar spine was performed without intravenous contrast administration. Multiplanar CT image reconstructions were also generated. RADIATION DOSE REDUCTION: This exam was performed according to the departmental dose-optimization program which includes automated exposure control, adjustment of the mA and/or kV according to patient size and/or use of iterative reconstruction technique. COMPARISON:  None Available. FINDINGS: Segmentation: 4 lumbar type vertebrae. There is transitional lumbosacral element. There is partial sacralization of L5. Alignment: Normal. Vertebrae: No acute fracture or focal pathologic process. Paraspinal and other soft tissues: Negative. Disc levels: Mildly reduced L4-5 and L5-S1 intervertebral disc heights. There is vacuum phenomena at L4-5 level. Minimal marginal osteophyte formation. IMPRESSION: *No acute osseous abnormality of the lumbar spine. Mild degenerative changes of the lower lumbar spine. Electronically Signed   By: Beula Brunswick M.D.   On: 05/29/2023 11:09   CT ABDOMEN PELVIS W CONTRAST Result Date: 05/29/2023 CLINICAL DATA:  Abdominal pain, acute, nonlocalized abd wall cellulitis, lower abd pain, right sided back pain EXAM: CT ABDOMEN AND PELVIS WITH CONTRAST TECHNIQUE: Multidetector CT imaging of the abdomen and pelvis was performed using the standard protocol following bolus administration of intravenous contrast. RADIATION DOSE REDUCTION: This exam was performed according to the departmental dose-optimization program which includes automated exposure control, adjustment of the mA and/or kV according to patient size and/or use of iterative reconstruction technique. CONTRAST:  75mL OMNIPAQUE IOHEXOL 350 MG/ML SOLN COMPARISON:  None Available. FINDINGS: Lower chest: The lung bases are clear. No pleural effusion. The heart is normal in size. No pericardial effusion. Hepatobiliary: The liver is normal in size.  Non-cirrhotic configuration. No suspicious mass. No intrahepatic or extrahepatic bile duct dilation. No calcified gallstones. Normal gallbladder wall thickness. No pericholecystic inflammatory changes. Pancreas: Unremarkable. No pancreatic ductal dilatation or surrounding inflammatory changes. Spleen: Within normal limits. No focal lesion. Adrenals/Urinary Tract: Adrenal glands are unremarkable. No suspicious renal mass. No hydronephrosis. No renal or ureteric calculi. Unremarkable urinary bladder. Stomach/Bowel: No disproportionate dilation of the small or large bowel loops. No evidence of abnormal bowel wall thickening or inflammatory changes. The appendix is surgically absent. Vascular/Lymphatic: No ascites or pneumoperitoneum. No abdominal or pelvic lymphadenopathy, by size criteria. No aneurysmal dilation of the major abdominal arteries. Reproductive: Normal size prostate. Symmetric seminal vesicles. Other: There is a lobulated mildly thick irregular walled hypoattenuating collection in the right paramedian periumbilical region measuring 2.9 x 4.4 cm. There is significant surrounding fat stranding. Findings favor periumbilical abscess. No extension of the abscess inside  the peritoneal cavity. There is mild asymmetric thickening of the overlying skin. There is a tiny fat containing left inguinal hernia. The soft tissues and abdominal wall are otherwise unremarkable. Musculoskeletal: No suspicious osseous lesions. There are mild multilevel degenerative changes in the visualized spine. IMPRESSION: 1. There is a 2.9 x 4.4 cm periumbilical abscess, as described above. No extension of the abscess inside the peritoneal cavity. 2. No other acute inflammatory process identified within the abdomen or pelvis. Electronically Signed   By: Beula Brunswick M.D.   On: 05/29/2023 11:04    Procedures Procedures    Medications Ordered in ED Medications  morphine (PF) 4 MG/ML injection 4 mg (has no administration in time  range)  ondansetron (ZOFRAN) injection 4 mg (has no administration in time range)  sodium chloride 0.9 % bolus 1,000 mL (has no administration in time range)    ED Course/ Medical Decision Making/ A&P Clinical Course as of 05/29/23 1349  Fri May 29, 2023  1229 Gen surg Dr Davonna Estes agrees to see patient.  [JB]  1341 Gen Surg has put in admit orders.  [JB]    Clinical Course User Index [JB] Christopher Long, Kandace Organ, PA-C                                 Medical Decision Making Amount and/or Complexity of Data Reviewed Labs: ordered. Radiology: ordered.  Risk OTC drugs. Prescription drug management.   This patient presents to the ED for concern of abd pain, this involves an extensive number of treatment options, and is a complaint that carries with it a high risk of complications and morbidity.  The differential diagnosis includes cellulitis, abscess, appendicitis, cholecystitis, gastroenteritis, diverticulitis, colitis, SBO    Lab Tests:  I personally interpreted labs.  The pertinent results include: CBC with mild leukocytosis 14.3 UA negative for UTI, no ketones   Imaging Studies ordered:  I ordered imaging studies including CT abd/pelvis   I independently visualized and interpreted imaging which showed lumbar without any acute abnormality or fracture. I agree with the radiologist interpretation   Cardiac Monitoring: / EKG:  The patient was maintained on a cardiac monitor.    Consultations Obtained:  I requested consultation with the Dr Davonna Estes,  and discussed lab and imaging findings as well as pertinent plan - they recommend: Admit    Problem List / ED Course / Critical interventions / Medication management  Patient presenting with abdominal pain.  On physical exam he has area of cellulitis and swelling around periumbilical region.  This is right over where he is having pain.  Given significance of symptoms we will obtain CT to rule out intra-abdominal abscess or other  acute pathology.  On arrival his vitals are stable and not febrile however he does report fever yesterday.  He has leukocytosis.  Lab work otherwise reassuring.  Will give fluids pain control and nausea medicine and reassess CT scan shows abdominal abscess.  Will consult general surgery for evaluation.  Obtaining blood cultures and lactic as patient is not tachycardic.  Started on Vanco for antibiotic. I ordered medication including Zofran, morphine, NS Reevaluation of the patient after these medicines showed that the patient improved I have reviewed the patients home medicines and have made adjustments as needed   Plan  Gen Surg         Final Clinical Impression(s) / ED Diagnoses Final diagnoses:  Abdominal wall abscess    Rx /  DC Orders ED Discharge Orders     None         Garnet Chatmon N, PA-C 05/29/23 1351    Carin Charleston, MD 05/29/23 1420

## 2023-05-29 NOTE — ED Notes (Signed)
 Surgery at bedside with pt.

## 2023-05-29 NOTE — ED Notes (Signed)
 Pt ambulated to the bathroom without assistance.

## 2023-05-29 NOTE — ED Triage Notes (Signed)
 Pt here for abd pain 2 days ago, pt states selling in abd. C/O fevers. Denies n/v/d. HX of appendectomy

## 2023-05-29 NOTE — Progress Notes (Signed)
 Pharmacy Antibiotic Note  Christopher Long is a 38 y.o. male admitted on 05/29/2023 with cellulitis/periumbilical abscess.  Pharmacy has been consulted for vancomycin dosing.  Due to the depth and size, patient likely not a candidate for I&D so plan for antibiotic therapy per surgery. Patient afebrile, WBC 14.3, and renal function okay (CrCl 123.3 mL/min).   Plan: Vancomycin 1750 mg IV x1, then vancomycin 1250 mg IV q12h (eAUC 483, Vd 0.5, Scr 1.05) Zosyn 3.375 g IV q8h  Vanc levels as needed Monitor for clinical improvement and de-escalate therapy when appropriate   Height: 5\' 11"  (180.3 cm) Weight: 117.9 kg (260 lb) IBW/kg (Calculated) : 75.3  Temp (24hrs), Avg:99.2 F (37.3 C), Min:98.5 F (36.9 C), Max:99.6 F (37.6 C)  Recent Labs  Lab 05/29/23 0749  WBC 14.3*  CREATININE 1.05    Estimated Creatinine Clearance: 123.3 mL/min (by C-G formula based on SCr of 1.05 mg/dL).    No Known Allergies  Antimicrobials this admission: Vancomycin 5/23 >>   Microbiology results: 5/23 BCx:   Thank you for allowing pharmacy to be a part of this patient's care.  Adaline Ada, PharmD PGY1 Pharmacy Resident 05/29/2023 1:50 PM

## 2023-05-29 NOTE — H&P (Signed)
 Christopher Long [birth year]  [id number].    Chief Complaint/Reason for Consult: abdominal wall cellulitis/fluid collection  HPI:  This is a pleasant, obese, black male who has a history of an appendectomy in [place]  several years ago who began having some periumbilical abdominal pain 2 days ago.  It hurts to bend or twist.  He developed some sweats and chills.  He had some nausea last night and some anorexia.  He had a normal BM yesterday.  He has not had any recent trauma to his abdomen, cuts, etc.  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  Due to persistent pain, he presented to the ED this am and is found to have a WBC of 14.3, AF, and a CT scan with a 2.9x4.4cm periumbilical abscess with no extension into the peritoneal cavity with some extensive surrounding stranding and inflammation.  We have been asked to see him for further recommendations.  ROS: ROS: see HPI  History reviewed. No pertinent family history.  Past Medical History:  Diagnosis Date   Asthma    Childhood, no longer an issue    Past Surgical History:  Procedure Laterality Date   APPENDECTOMY     TARSAL TUNNEL RELEASE     TARSAL TUNNEL RELEASE      Social History:  reports that he has never smoked. He has never used smokeless tobacco. He reports that he does not drink alcohol and does not use drugs.  Allergies: No Known Allergies  (Not in a hospital admission)    Physical Exam: Blood pressure (!) 147/85, pulse (!) 112, temperature 99.5 F (37.5 C), temperature source Oral, resp. rate 16, height 5\' 11"  (1.803 m), weight 117.9 kg, SpO2 99%. General: pleasant, WD, WN, obese black male who is laying in bed in NAD HEENT: head is normocephalic, atraumatic.  Sclera are noninjected.  PERRL.  Ears and nose without any masses or lesions.  Mouth is pink and moist Heart: regular, rate, and rhythm, around 100.  Normal s1,s2. No obvious murmurs, gallops, or rubs noted.  Palpable radial pulses bilaterally Lungs: CTAB,  no wheezes, rhonchi, or rales noted.  Respiratory effort nonlabored Abd: soft, tender periumbilically with some mild cellulitis and edema noted just inferior to his umbilicus.  There is some induration but this is noted around his scar which is slightly keloid, no fluctuance noted, ND, +BS, no masses, hernias, or organomegaly Psych: A&Ox3 with an appropriate affect.   Results for orders placed or performed during the hospital encounter of 05/29/23 (from the past 48 hours)  Lipase, blood     Status: None   Collection Time: 05/29/23  7:49 AM  Result Value Ref Range   Lipase 25 11 - 51 U/L    Comment: Performed at [facility], [address], [place], [place] [address]  Comprehensive metabolic panel     Status: Abnormal   Collection Time: 05/29/23  7:49 AM  Result Value Ref Range   Sodium 134 (L) 135 - 145 mmol/L   Potassium 3.7 3.5 - 5.1 mmol/L   Chloride 102 98 - 111 mmol/L   CO2 23 22 - 32 mmol/L   Glucose, Bld 108 (H) 70 - 99 mg/dL    Comment: Glucose reference range applies only to samples taken after fasting for at least 8 hours.   BUN 8 6 - 20 mg/dL   Creatinine, Ser 6.06 0.61 - 1.24 mg/dL   Calcium 9.1 8.9 - 30.1 mg/dL   Total Protein 7.8 6.5 -  8.1 g/dL   Albumin 3.8 3.5 - 5.0 g/dL   AST 20 15 - 41 U/L   ALT 21 0 - 44 U/L   Alkaline Phosphatase 57 38 - 126 U/L   Total Bilirubin 1.0 0.0 - 1.2 mg/dL   GFR, Estimated >09 >60 mL/min    Comment: (NOTE) Calculated using the CKD-EPI Creatinine Equation (2021)    Anion gap 9 5 - 15    Comment: Performed at [facility], [address], [place], [place] [address]  CBC     Status: Abnormal   Collection Time: 05/29/23  7:49 AM  Result Value Ref Range   WBC 14.3 (H) 4.0 - 10.5 K/uL   RBC 5.21 4.22 - 5.81 MIL/uL   Hemoglobin 14.2 13.0 - 17.0 g/dL   HCT 81.1 91.4 - 78.2 %   MCV 82.7 80.0 - 100.0 fL   MCH 27.3 26.0 - 34.0 pg   MCHC 32.9 30.0 - 36.0 g/dL   RDW 95.6 21.3 - 08.6 %   Platelets 277 150 - 400 K/uL   nRBC  0.0 0.0 - 0.2 %    Comment: Performed at [facility], [address], [place], [place] [address]  Urinalysis, Routine w reflex microscopic -Urine, Clean Catch     Status: Abnormal   Collection Time: 05/29/23  8:08 AM  Result Value Ref Range   Color, Urine YELLOW YELLOW   APPearance CLEAR CLEAR   Specific Gravity, Urine 1.020 1.005 - 1.030   pH 7.0 5.0 - 8.0   Glucose, UA 150 (A) NEGATIVE mg/dL   Hgb urine dipstick NEGATIVE NEGATIVE   Bilirubin Urine NEGATIVE NEGATIVE   Ketones, ur NEGATIVE NEGATIVE mg/dL   Protein, ur NEGATIVE NEGATIVE mg/dL   Nitrite NEGATIVE NEGATIVE   Leukocytes,Ua NEGATIVE NEGATIVE    Comment: Performed at [facility], [address], [place], [place] [address]   CT L-SPINE NO CHARGE Result Date: 05/29/2023 CLINICAL DATA:  Lower abdominal pain.  Right-sided back pain. EXAM: CT LUMBAR SPINE WITHOUT CONTRAST TECHNIQUE: Multidetector CT imaging of the lumbar spine was performed without intravenous contrast administration. Multiplanar CT image reconstructions were also generated. RADIATION DOSE REDUCTION: This exam was performed according to the departmental dose-optimization program which includes automated exposure control, adjustment of the mA and/or kV according to patient size and/or use of iterative reconstruction technique. COMPARISON:  None Available. FINDINGS: Segmentation: 4 lumbar type vertebrae. There is transitional lumbosacral element. There is partial sacralization of L5. Alignment: Normal. Vertebrae: No acute fracture or focal pathologic process. Paraspinal and other soft tissues: Negative. Disc levels: Mildly reduced L4-5 and L5-S1 intervertebral disc heights. There is vacuum phenomena at L4-5 level. Minimal marginal osteophyte formation. IMPRESSION: *No acute osseous abnormality of the lumbar spine. Mild degenerative changes of the lower lumbar spine. Electronically Signed   By: Beula Brunswick M.D.   On: 05/29/2023 11:09   CT ABDOMEN PELVIS W  CONTRAST Result Date: 05/29/2023 CLINICAL DATA:  Abdominal pain, acute, nonlocalized abd wall cellulitis, lower abd pain, right sided back pain EXAM: CT ABDOMEN AND PELVIS WITH CONTRAST TECHNIQUE: Multidetector CT imaging of the abdomen and pelvis was performed using the standard protocol following bolus administration of intravenous contrast. RADIATION DOSE REDUCTION: This exam was performed according to the departmental dose-optimization program which includes automated exposure control, adjustment of the mA and/or kV according to patient size and/or use of iterative reconstruction technique. CONTRAST:  75mL OMNIPAQUE IOHEXOL 350 MG/ML SOLN COMPARISON:  None Available. FINDINGS: Lower chest:  The lung bases are clear. No pleural effusion. The heart is normal in size. No pericardial effusion. Hepatobiliary: The liver is normal in size. Non-cirrhotic configuration. No suspicious mass. No intrahepatic or extrahepatic bile duct dilation. No calcified gallstones. Normal gallbladder wall thickness. No pericholecystic inflammatory changes. Pancreas: Unremarkable. No pancreatic ductal dilatation or surrounding inflammatory changes. Spleen: Within normal limits. No focal lesion. Adrenals/Urinary Tract: Adrenal glands are unremarkable. No suspicious renal mass. No hydronephrosis. No renal or ureteric calculi. Unremarkable urinary bladder. Stomach/Bowel: No disproportionate dilation of the small or large bowel loops. No evidence of abnormal bowel wall thickening or inflammatory changes. The appendix is surgically absent. Vascular/Lymphatic: No ascites or pneumoperitoneum. No abdominal or pelvic lymphadenopathy, by size criteria. No aneurysmal dilation of the major abdominal arteries. Reproductive: Normal size prostate. Symmetric seminal vesicles. Other: There is a lobulated mildly thick irregular walled hypoattenuating collection in the right paramedian periumbilical region measuring 2.9 x 4.4 cm. There is significant  surrounding fat stranding. Findings favor periumbilical abscess. No extension of the abscess inside the peritoneal cavity. There is mild asymmetric thickening of the overlying skin. There is a tiny fat containing left inguinal hernia. The soft tissues and abdominal wall are otherwise unremarkable. Musculoskeletal: No suspicious osseous lesions. There are mild multilevel degenerative changes in the visualized spine. IMPRESSION: 1. There is a 2.9 x 4.4 cm periumbilical abscess, as described above. No extension of the abscess inside the peritoneal cavity. 2. No other acute inflammatory process identified within the abdomen or pelvis. Electronically Signed   By: Beula Brunswick M.D.   On: 05/29/2023 11:04      Assessment/Plan Abdominal wall cellulitis and abscess/phlegmon The patient has been seen, examined, labs, vitals, chart, and imaging personally reviewed.  He appears to have a small abscess/phlegmon developing deep in his subcutaneous tissues just on top of his fascia.  This does not extend intra-abdominally.  This is not palpable or visible on external exam.  Given the depth this would be difficult to I&D.  The size is likely small for an IR drain.  We will plan to admit for IV abx therapy.  I will discuss with Dr. Davonna Estes the benefit of possible image guided aspiration by IR vs other plans.  I will keep him NPO for right now just in case the plan changes for OR upon his evaluation.  If no OR planned then will allow a diet.  The patient and I have discussed all of these possibilities.  He is agreeable to admission and treatment.     FEN - NPO/IVFs VTE - lovenox ID - zosyn/vanc Admit - inpatient  I reviewed nursing notes, ED provider notes, last 24 h vitals and pain scores, last 48 h intake and output, last 24 h labs and trends, and last 24 h imaging results.  Leone Ralphs, [facility] 05/29/2023, 1:41 PM Please see Amion for pager number during day hours 7:00am-4:30pm or  7:00am -11:30am on weekends

## 2023-05-30 ENCOUNTER — Inpatient Hospital Stay (HOSPITAL_COMMUNITY): Payer: Self-pay

## 2023-05-30 LAB — BASIC METABOLIC PANEL WITH GFR
Anion gap: 8 (ref 5–15)
BUN: 12 mg/dL (ref 6–20)
CO2: 22 mmol/L (ref 22–32)
Calcium: 8.6 mg/dL — ABNORMAL LOW (ref 8.9–10.3)
Chloride: 102 mmol/L (ref 98–111)
Creatinine, Ser: 1.33 mg/dL — ABNORMAL HIGH (ref 0.61–1.24)
GFR, Estimated: >60 mL/min (ref >60)
Glucose, Bld: 112 mg/dL — ABNORMAL HIGH (ref 70–99)
Potassium: 3.7 mmol/L (ref 3.5–5.1)
Sodium: 132 mmol/L — ABNORMAL LOW (ref 135–145)

## 2023-05-30 LAB — CBC
HCT: 41.1 % (ref 39.0–52.0)
Hemoglobin: 13.8 g/dL (ref 13.0–17.0)
MCH: 27.4 pg (ref 26.0–34.0)
MCHC: 33.6 g/dL (ref 30.0–36.0)
MCV: 81.5 fL (ref 80.0–100.0)
Platelets: 279 10*3/uL (ref 150–400)
RBC: 5.04 MIL/uL (ref 4.22–5.81)
RDW: 13.5 % (ref 11.5–15.5)
WBC: 13 10*3/uL — ABNORMAL HIGH (ref 4.0–10.5)
nRBC: 0 % (ref 0.0–0.2)

## 2023-05-30 MED ORDER — OXYCODONE HCL 5 MG PO TABS: 5.0000 mg | ORAL_TABLET | Freq: Three times a day (TID) | ORAL | 0 refills | Status: AC | PRN

## 2023-05-30 MED ORDER — ACETAMINOPHEN 325 MG PO TABS: 650.0000 mg | ORAL_TABLET | Freq: Four times a day (QID) | ORAL | 0 refills | Status: AC

## 2023-05-30 MED ORDER — LIDOCAINE HCL (PF) 1 % IJ SOLN
10.0000 mL | Freq: Once | INTRAMUSCULAR | Status: AC
  Administered 2023-05-30: 10 mL via INTRADERMAL

## 2023-05-30 MED ORDER — IBUPROFEN 200 MG PO TABS: 600.0000 mg | ORAL_TABLET | Freq: Four times a day (QID) | ORAL | 0 refills | Status: AC

## 2023-05-30 MED ORDER — AMOXICILLIN-POT CLAVULANATE 875-125 MG PO TABS: 1.0000 | ORAL_TABLET | Freq: Two times a day (BID) | ORAL | 0 refills | Status: AC

## 2023-05-30 MED ORDER — AMOXICILLIN-POT CLAVULANATE 875-125 MG PO TABS
1.0000 | ORAL_TABLET | Freq: Two times a day (BID) | ORAL | Status: DC
  Administered 2023-05-30: 1 via ORAL
  Filled 2023-05-30: qty 1

## 2023-05-30 NOTE — Progress Notes (Signed)
  Subjective No acute events. Stable swelling symptoms at umbilicus.   Objective: Vital signs in last 24 hours: Temp:  [98.4 F (36.9 C)-99.5 F (37.5 C)] 99.3 F (37.4 C) (05/24 0756) Pulse Rate:  [80-114] 96 (05/24 0756) Resp:  [16-18] 17 (05/24 0756) BP: (120-162)/(76-107) 162/94 (05/24 0756) SpO2:  [97 %-100 %] 98 % (05/24 0756) Last BM Date : 05/28/23  Intake/Output from previous day: 05/23 0701 - 05/24 0700 In: 1479.2 [P.O.:100; I.V.:870; IV Piggyback:509.2] Out: -  Intake/Output this shift: No intake/output data recorded.  Gen: NAD, comfortable CV: RRR Pulm: Normal work of breathing Abd: Soft, NT/ND; periumbilical induraiton and warmth. No drainage or open wounds. Scar consistent with historic surgery Ext: SCDs in place  Lab Results: CBC  Recent Labs    05/29/23 0749 05/30/23 0224  WBC 14.3* 13.0*  HGB 14.2 13.8  HCT 43.1 41.1  PLT 277 279   BMET Recent Labs    05/29/23 0749 05/30/23 0224  NA 134* 132*  K 3.7 3.7  CL 102 102  CO2 23 22  GLUCOSE 108* 112*  BUN 8 12  CREATININE 1.05 1.33*  CALCIUM 9.1 8.6*   PT/INR No results for input(s): "LABPROT", "INR" in the last 72 hours. ABG No results for input(s): "PHART", "HCO3" in the last 72 hours.  Invalid input(s): "PCO2", "PO2"  Studies/Results:  Anti-infectives: Anti-infectives (From admission, onward)    Start     Dose/Rate Route Frequency Ordered Stop   05/30/23 1100  Vancomycin (VANCOCIN) 1,250 mg in sodium chloride 0.9 % 250 mL IVPB  Status:  Discontinued        1,250 mg 166.7 mL/hr over 90 Minutes Intravenous Every 12 hours 05/29/23 1401 05/29/23 1442   05/29/23 2300  Vancomycin (VANCOCIN) 1,250 mg in sodium chloride 0.9 % 250 mL IVPB        1,250 mg 166.7 mL/hr over 90 Minutes Intravenous Every 12 hours 05/29/23 1442     05/29/23 1400  piperacillin-tazobactam (ZOSYN) IVPB 3.375 g        3.375 g 12.5 mL/hr over 240 Minutes Intravenous Every 8 hours 05/29/23 1339 06/05/23 1359    05/29/23 1115  vancomycin (VANCOCIN) IVPB 1000 mg/200 mL premix  Status:  Discontinued        1,000 mg 200 mL/hr over 60 Minutes Intravenous  Once 05/29/23 1113 05/29/23 1114   05/29/23 1115  vancomycin (VANCOREADY) IVPB 1750 mg/350 mL        1,750 mg 175 mL/hr over 120 Minutes Intravenous  Once 05/29/23 1114 05/29/23 1300        Assessment/Plan: Patient Active Problem List   Diagnosis Date Noted   Abdominal wall fluid collections 05/29/2023   Abdominal wall (periumbilical) cellulitis and abscess/phlegmon   - Cont IV abx - IR taking for aspiration today - Ok for diet following per primary - We will follow with you   LOS: 1 day   I spent a total of 35 minutes in both face-to-face and non-face-to-face activities, excluding procedures performed, for this visit on the date of this encounter.  Beatris Lincoln, MD [facility], A [facility]

## 2023-05-30 NOTE — Plan of Care (Signed)
  Problem: Pain Managment: Goal: General experience of comfort will improve and/or be controlled Outcome: Progressing   Problem: Safety: Goal: Ability to remain free from injury will improve Outcome: Progressing

## 2023-05-30 NOTE — Discharge Summary (Signed)
 Physician Discharge Summary  Patient ID: Christopher Long MRN: [id number] DOB/AGE: 06/30/1985 38 y.o.  Admit date: 05/29/2023 Discharge date: 05/30/2023  Admission Diagnoses:  Discharge Diagnoses:  Principal Problem:   Abdominal wall fluid collections   Discharged Condition: stable  Hospital Course: Lj presented to the ED with significant abdominal pain that occurred while he was driving. He underwent a CT that showed an abdominal wall abscess near the umbilicus without evidence of a hernia. He was admitted for antibiotics and drainage. He was started on Zosyn and will transition to Augmentin at discharge for a 10 day course. His abscess was aspirated on 5/24 by IR. He remained hemodynamically stable throughout his stay. He discharged to home on HD 2. At the time of discharge he was ambulating at baseline, his pain was well controlled, and he was not showing systemic signs of infection.   Consults: IR  Significant Diagnostic Studies: CT  Treatments: IR aspiration (5/24)  Discharge Exam: Blood pressure 127/85, pulse (!) 107, temperature 100 F (37.8 C), temperature source Oral, resp. rate 20, height 5\' 11"  (1.803 m), weight 117.9 kg, SpO2 97%. Gen: male, NAD Abd: soft, non-distended, mild TTP, no rebound/guarding, no peritoneal signs  Disposition: Discharge disposition: 01-Home or Self Care        Allergies as of 05/30/2023       Reactions   Azithromycin Nausea Only        Medication List     STOP taking these medications    cetirizine  10 MG tablet Commonly known as: ZyrTEC  Allergy       TAKE these medications    acetaminophen 325 MG tablet Commonly known as: Tylenol Take 2 tablets (650 mg total) by mouth every 6 (six) hours for 6 days. What changed:  medication strength how much to take when to take this reasons to take this   amoxicillin -clavulanate 875-125 MG tablet Commonly known as: AUGMENTIN Take 1 tablet by mouth 2 (two) times daily for 10  days.   ibuprofen  200 MG tablet Commonly known as: Motrin  IB Take 3 tablets (600 mg total) by mouth every 6 (six) hours for 6 days. What changed:  medication strength when to take this reasons to take this   oxyCODONE 5 MG immediate release tablet Commonly known as: Roxicodone Take 1 tablet (5 mg total) by mouth every 8 (eight) hours as needed for up to 4 days.         Signed: Cannon Champion 05/30/2023, 1:41 PM

## 2023-05-30 NOTE — Procedures (Signed)
 Interventional Radiology Procedure Note  Procedure: Image guided aspiration of abd wall abscess at the ant abdominal wall.  ~15cc purulent material.  Residual phlegmon/necrotic tissue.   Complications: None  EBL: None Sample: Culture sent  Recommendations: - Routine wound care - follow up Cx   Signed,  Marciano Settles. Mabel Savage, DO

## 2023-05-30 NOTE — Plan of Care (Signed)

## 2023-05-30 NOTE — Plan of Care (Signed)
  Problem: Education: Goal: Knowledge of General Education information will improve Description: Including pain rating scale, medication(s)/side effects and non-pharmacologic comfort measures 05/30/2023 1357 by Dilynn Munroe K, RN Outcome: Adequate for Discharge 05/30/2023 1357 by Othal Kubitz K, RN Outcome: Progressing   Problem: Health Behavior/Discharge Planning: Goal: Ability to manage health-related needs will improve 05/30/2023 1357 by Hazelynn Mckenny K, RN Outcome: Adequate for Discharge 05/30/2023 1357 by Naraya Stoneberg K, RN Outcome: Progressing   Problem: Clinical Measurements: Goal: Ability to maintain clinical measurements within normal limits will improve 05/30/2023 1357 by Shylo Dillenbeck K, RN Outcome: Adequate for Discharge 05/30/2023 1357 by Anay Rathe K, RN Outcome: Progressing Goal: Will remain free from infection 05/30/2023 1357 by Curtina Grills K, RN Outcome: Adequate for Discharge 05/30/2023 1357 by Marceline Napierala K, RN Outcome: Progressing Goal: Diagnostic test results will improve 05/30/2023 1357 by Luria Rosario K, RN Outcome: Adequate for Discharge 05/30/2023 1357 by Maryem Shuffler K, RN Outcome: Progressing Goal: Respiratory complications will improve 05/30/2023 1357 by Malori Myers K, RN Outcome: Adequate for Discharge 05/30/2023 1357 by Andreu Drudge K, RN Outcome: Progressing Goal: Cardiovascular complication will be avoided 05/30/2023 1357 by Rola Lennon K, RN Outcome: Adequate for Discharge 05/30/2023 1357 by Abia Monaco K, RN Outcome: Progressing   Problem: Activity: Goal: Risk for activity intolerance will decrease 05/30/2023 1357 by Karalyn Kadel K, RN Outcome: Adequate for Discharge 05/30/2023 1357 by Eleonore Grill, RN Outcome: Progressing   Problem: Nutrition: Goal: Adequate nutrition will be maintained 05/30/2023 1357 by Jaclyne Haverstick K, RN Outcome: Adequate for Discharge 05/30/2023 1357 by Eleonore Grill, RN Outcome:  Progressing   Problem: Coping: Goal: Level of anxiety will decrease 05/30/2023 1357 by Shahana Capes K, RN Outcome: Adequate for Discharge 05/30/2023 1357 by Eleonore Grill, RN Outcome: Progressing   Problem: Elimination: Goal: Will not experience complications related to bowel motility 05/30/2023 1357 by Shaquille Murdy K, RN Outcome: Adequate for Discharge 05/30/2023 1357 by Eleonore Grill, RN Outcome: Progressing Goal: Will not experience complications related to urinary retention 05/30/2023 1357 by Krishay Faro K, RN Outcome: Adequate for Discharge 05/30/2023 1357 by Eleonore Grill, RN Outcome: Progressing   Problem: Pain Managment: Goal: General experience of comfort will improve and/or be controlled 05/30/2023 1357 by Davier Tramell K, RN Outcome: Adequate for Discharge 05/30/2023 1357 by Eleonore Grill, RN Outcome: Progressing   Problem: Safety: Goal: Ability to remain free from injury will improve 05/30/2023 1357 by Trishna Cwik K, RN Outcome: Adequate for Discharge 05/30/2023 1357 by Eleonore Grill, RN Outcome: Progressing   Problem: Skin Integrity: Goal: Risk for impaired skin integrity will decrease 05/30/2023 1357 by Dovey Fatzinger K, RN Outcome: Adequate for Discharge 05/30/2023 1357 by Kaydon Husby K, RN Outcome: Progressing

## 2023-05-30 NOTE — Discharge Instructions (Signed)
 Home Care Instruction  Activity  Limit activity for the first 24 hours, then you may return to normal daily activities. Returning to normal daily activities as soon as you can following surgery will enhance recovery time.  Do not drive or operate heavy machinery within 24 hours of taking narcotic pain medications.   Diet Drink plenty of fluids and eat light meals today, then resume regular diet. Some patients may find their appetite is poor for a week or two after surgery. This is a normal result of the stress of surgery-your appetite will return in time.   There are no specific diet restrictions after your procedure.   Dressing and Wound Care  Any dressing in place can be removed the day after your procedure  What to Expect After Surgery   Moderate discomfort controlled with medications  Feeling fatigue and weak  Drink plenty fluids and eat a high fiber diet.  Pain Control: Prescribed Non-Narcotic Pain Medication  You will be given three prescriptions.  Two of them will be for prescription strength ibuprofen  (i.e. Advil ) and prescription strength acetaminophen (i.e. Tylenol).  The vast majority of patients will just need these two medications.  One prescription will be for a 'rescue' prescription of an oral narcotic (oxycodone).  You may fill this if needed.  You will alternate taking the ibuprofen  (600mg ) every 6 hours and also the acetaminophen (650mg ) every 6 hours so that you are taking one of those medications every 3 hours.  For example: o 0800 - take ibuprofen  600mg  o 1100 - take acetaminophen 650mg  o 1400 - take ibuprofen  600mg  o 1700 - take acetaminophen 650mg  o Etc.  Continue taking this alternating pattern of ibuprofen  and acetaminophen for 3 days  If you cannot take one or the other of these medications, just take the one you can every 6 hours.  If you are comfortable at night, you don't have to wake up and take a medication.  If you are still uncomfortable after taking  either ibuprofen  or acetaminophen, try gentle stretching exercise and ice packs (a bag of frozen vegetables works great).  If you are still uncomfortable, you may fill the narcotic prescription of Oxycodone and take as directed.  Once you have completed these prescriptions, your pain level should be low enough to stop taking medications altogether or just use an over the counter medication (ibuprofen  or acetaminophen) as needed.    Pain Control: Over the Counter Medications to take as needed  Colace/Docusate: May be prescribed by your surgeon to prevent constipation caused by the combination of narcotics, effects of anesthesia, and decreased ambulation.  Hold for loose stools or diarrhea. Take 100 mg 1-2 times a day starting tonight.   Fiber: High fiber foods, extra liquids (water 9-13 cups/day) can also assist with constipation. Examples of high fiber foods are fruit, bran. Prune juice and water are also good liquids to drink.  Milk of Magnesia/Miralax:  If constipated despite takeing the over the counter stool softeners, you may take Milk of Magnesia or Miralax as directed on bottle to assist with constipation.     Pepcid/Famotidine: May be prescribed while taking naproxen (Aleve) or other NSAIDs such as ibuprofen  (Motrin /Advil ) to prevent stomach upset or Acid-reflux symptoms. Take 1 tablet 1-2 times a day.   **Constipation: The first bowel movement may occur anywhere between 1-5 days after surgery.  As long as you are not nauseated or not having significant abdominal pain this variation is acceptable. Narcotic pain medications can cause constipation increasing  discomfort; early discontinuation will assist with bowel management. If constipated despite taking stool softeners, you may take Milk of Magnesia or Miralax as directed on the bottle.     **Home medications: You may restart your home medications as directed by your respective Primary Care Physician or Surgeon.   When to notify your Doctor or  Healthcare Team   Sign of Wound Infection   Fever over 100 degrees.  Wound becomes extremely swollen, shows red streaks, warm to the touch, and/or drainage from the incision site or foul-smelling drainage.  Wound edges separate or opens up  Bleeding or bruising   If you have bleeding, apply pressure to the site and hold the pressure firmly for 5 minutes. If the bleeding continues, apply pressure again and call 911. If the bleeding stopped, call your doctor to report it.   Call your doctor or nurse if you have increased bleeding from your site and increased bruising or a lump forms or gets larger under your skin at the site. Unrelieved Pain   Call your doctor or nurse if your pain gets worse or is not eased 1 hour after taking your pain medicine, or if it is severe and uncontrolled. Nausea and Vomiting   Call your doctor or nurse if you have nausea and vomiting that continues more than 24 hours, will not let you keep medicine down and will not let you keep fluids down  Fever, Flu-like symptoms   Fever over 100 degrees and/or chills  Gastrointestinal Bleeding Symptoms    Black tarry bowel movements.  This can be normal after surgery on the stomach, but should resolve in a day or two.    Call 911 if you suddenly have signs of blood loss such as:  Vomiting blood  Fast heart rate  Feeling faint, sweaty, or blacking out  Passing bright red blood from your rectum  Blood Clot Symptoms   Tender, swollen or reddened areas in your calf muscle or thighs.  Numbness or tingling in your lower leg or calf, or at the top of your leg or groin  Skin on your leg looks pale or blue or feels cold to touch  Chest pain or have trouble breathing, lightheadedness, fast heart rate  Sudden Onset of Symptoms    Call 911 if you suddenly have:  Leg weakness and spasm  Loss of bladder or bowel function  Seizure  Confusion, severe headache, dizziness or feeling unsteady, problems talking, difficulty swallowing,  and/or numbness or muscle weakness as these could be signs of a stroke.  Follow up Appointment Your follow up appointment should be scheduled 2-3 weeks after your surgery date.  If you have not previously scheduled for a follow-up visit you can be scheduled by contacting [phone].

## 2023-06-02 LAB — AEROBIC/ANAEROBIC CULTURE W GRAM STAIN (SURGICAL/DEEP WOUND)

## 2023-06-03 LAB — CULTURE, BLOOD (ROUTINE X 2)
Culture: NO GROWTH
Culture: NO GROWTH
Special Requests: ADEQUATE
